# Patient Record
Sex: Male | Born: 1975 | Race: White | Hispanic: No | Smoking: Never smoker
Health system: Southern US, Community
[De-identification: ages and names within clinical notes are randomized; demographics above are authoritative.]

## PROBLEM LIST (undated history)

## (undated) ENCOUNTER — Ambulatory Visit: Admission: EM | Payer: BC Managed Care – PPO

## (undated) DIAGNOSIS — I10 Essential (primary) hypertension: Secondary | ICD-10-CM

## (undated) DIAGNOSIS — E785 Hyperlipidemia, unspecified: Secondary | ICD-10-CM

## (undated) DIAGNOSIS — S83411S Sprain of medial collateral ligament of right knee, sequela: Secondary | ICD-10-CM

## (undated) DIAGNOSIS — E291 Testicular hypofunction: Secondary | ICD-10-CM

## (undated) DIAGNOSIS — E6609 Other obesity due to excess calories: Secondary | ICD-10-CM

## (undated) DIAGNOSIS — N529 Male erectile dysfunction, unspecified: Secondary | ICD-10-CM

## (undated) DIAGNOSIS — K219 Gastro-esophageal reflux disease without esophagitis: Secondary | ICD-10-CM

## (undated) DIAGNOSIS — E118 Type 2 diabetes mellitus with unspecified complications: Secondary | ICD-10-CM

## (undated) DIAGNOSIS — F419 Anxiety disorder, unspecified: Secondary | ICD-10-CM

## (undated) DIAGNOSIS — E783 Hyperchylomicronemia: Secondary | ICD-10-CM

## (undated) HISTORY — DX: Other obesity due to excess calories: E66.09

## (undated) HISTORY — DX: Essential (primary) hypertension: I10

## (undated) HISTORY — DX: Anxiety disorder, unspecified: F41.9

## (undated) HISTORY — DX: Sprain of medial collateral ligament of right knee, sequela: S83.411S

## (undated) HISTORY — DX: Male erectile dysfunction, unspecified: N52.9

## (undated) HISTORY — DX: Gastro-esophageal reflux disease without esophagitis: K21.9

## (undated) HISTORY — DX: Type 2 diabetes mellitus with unspecified complications: E11.8

## (undated) HISTORY — DX: Hyperchylomicronemia: E78.3

## (undated) HISTORY — DX: Hyperlipidemia, unspecified: E78.5

## (undated) HISTORY — DX: Testicular hypofunction: E29.1

---

## 1998-09-09 HISTORY — PX: LAPAROSCOPIC APPENDECTOMY: SUR753

## 2009-01-24 ENCOUNTER — Encounter: Admission: RE | Admit: 2009-01-24 | Discharge: 2009-01-24 | Payer: Self-pay | Admitting: Family Medicine

## 2012-03-22 HISTORY — PX: LEFT HEART CATH AND CORONARY ANGIOGRAPHY: CATH118249

## 2014-09-09 HISTORY — PX: HERNIA REPAIR: SHX51

## 2017-08-19 DIAGNOSIS — L02415 Cutaneous abscess of right lower limb: Secondary | ICD-10-CM | POA: Diagnosis not present

## 2017-08-19 DIAGNOSIS — L03314 Cellulitis of groin: Secondary | ICD-10-CM | POA: Diagnosis not present

## 2018-01-26 DIAGNOSIS — I10 Essential (primary) hypertension: Secondary | ICD-10-CM | POA: Diagnosis not present

## 2018-01-26 DIAGNOSIS — K429 Umbilical hernia without obstruction or gangrene: Secondary | ICD-10-CM | POA: Diagnosis not present

## 2018-01-26 DIAGNOSIS — K219 Gastro-esophageal reflux disease without esophagitis: Secondary | ICD-10-CM | POA: Diagnosis not present

## 2018-02-06 DIAGNOSIS — K432 Incisional hernia without obstruction or gangrene: Secondary | ICD-10-CM | POA: Diagnosis not present

## 2018-02-06 DIAGNOSIS — M6208 Separation of muscle (nontraumatic), other site: Secondary | ICD-10-CM | POA: Diagnosis not present

## 2018-02-17 DIAGNOSIS — I10 Essential (primary) hypertension: Secondary | ICD-10-CM | POA: Diagnosis not present

## 2018-02-24 DIAGNOSIS — I10 Essential (primary) hypertension: Secondary | ICD-10-CM | POA: Diagnosis not present

## 2018-03-02 DIAGNOSIS — K432 Incisional hernia without obstruction or gangrene: Secondary | ICD-10-CM | POA: Diagnosis not present

## 2018-03-02 DIAGNOSIS — E119 Type 2 diabetes mellitus without complications: Secondary | ICD-10-CM | POA: Diagnosis not present

## 2018-03-02 DIAGNOSIS — K660 Peritoneal adhesions (postprocedural) (postinfection): Secondary | ICD-10-CM | POA: Diagnosis not present

## 2018-03-02 DIAGNOSIS — K436 Other and unspecified ventral hernia with obstruction, without gangrene: Secondary | ICD-10-CM | POA: Diagnosis not present

## 2018-03-26 DIAGNOSIS — Z6836 Body mass index (BMI) 36.0-36.9, adult: Secondary | ICD-10-CM | POA: Diagnosis not present

## 2018-03-26 DIAGNOSIS — I10 Essential (primary) hypertension: Secondary | ICD-10-CM | POA: Diagnosis not present

## 2018-05-12 DIAGNOSIS — Z79899 Other long term (current) drug therapy: Secondary | ICD-10-CM | POA: Diagnosis not present

## 2018-05-12 DIAGNOSIS — I10 Essential (primary) hypertension: Secondary | ICD-10-CM | POA: Diagnosis not present

## 2018-05-12 DIAGNOSIS — Z833 Family history of diabetes mellitus: Secondary | ICD-10-CM | POA: Diagnosis not present

## 2018-05-12 DIAGNOSIS — E782 Mixed hyperlipidemia: Secondary | ICD-10-CM | POA: Diagnosis not present

## 2018-05-15 DIAGNOSIS — E349 Endocrine disorder, unspecified: Secondary | ICD-10-CM | POA: Diagnosis not present

## 2018-05-29 DIAGNOSIS — E291 Testicular hypofunction: Secondary | ICD-10-CM | POA: Diagnosis not present

## 2019-04-01 ENCOUNTER — Other Ambulatory Visit: Payer: Self-pay | Admitting: Physician Assistant

## 2019-04-01 DIAGNOSIS — S83401A Sprain of unspecified collateral ligament of right knee, initial encounter: Secondary | ICD-10-CM

## 2019-04-02 ENCOUNTER — Other Ambulatory Visit: Payer: Self-pay | Admitting: Physician Assistant

## 2019-04-02 DIAGNOSIS — S83401A Sprain of unspecified collateral ligament of right knee, initial encounter: Secondary | ICD-10-CM

## 2019-04-17 ENCOUNTER — Other Ambulatory Visit: Payer: Self-pay

## 2019-04-17 ENCOUNTER — Ambulatory Visit
Admission: RE | Admit: 2019-04-17 | Discharge: 2019-04-17 | Disposition: A | Discharge status: Home / Self Care | Payer: Self-pay | Source: Ambulatory Visit | Attending: Physician Assistant | Admitting: Physician Assistant

## 2019-04-17 DIAGNOSIS — S83401A Sprain of unspecified collateral ligament of right knee, initial encounter: Secondary | ICD-10-CM

## 2019-10-15 DIAGNOSIS — E291 Testicular hypofunction: Secondary | ICD-10-CM | POA: Diagnosis not present

## 2019-10-29 DIAGNOSIS — E291 Testicular hypofunction: Secondary | ICD-10-CM | POA: Diagnosis not present

## 2019-11-04 DIAGNOSIS — R0602 Shortness of breath: Secondary | ICD-10-CM | POA: Diagnosis not present

## 2019-11-04 DIAGNOSIS — G4733 Obstructive sleep apnea (adult) (pediatric): Secondary | ICD-10-CM | POA: Diagnosis not present

## 2019-11-05 DIAGNOSIS — R0602 Shortness of breath: Secondary | ICD-10-CM | POA: Diagnosis not present

## 2019-11-05 DIAGNOSIS — G4733 Obstructive sleep apnea (adult) (pediatric): Secondary | ICD-10-CM | POA: Diagnosis not present

## 2019-11-15 DIAGNOSIS — E291 Testicular hypofunction: Secondary | ICD-10-CM | POA: Diagnosis not present

## 2019-12-03 DIAGNOSIS — E291 Testicular hypofunction: Secondary | ICD-10-CM | POA: Diagnosis not present

## 2019-12-17 DIAGNOSIS — E291 Testicular hypofunction: Secondary | ICD-10-CM | POA: Diagnosis not present

## 2020-01-14 DIAGNOSIS — K219 Gastro-esophageal reflux disease without esophagitis: Secondary | ICD-10-CM | POA: Diagnosis not present

## 2020-01-14 DIAGNOSIS — E781 Pure hyperglyceridemia: Secondary | ICD-10-CM | POA: Diagnosis not present

## 2020-01-14 DIAGNOSIS — E291 Testicular hypofunction: Secondary | ICD-10-CM | POA: Diagnosis not present

## 2020-01-14 DIAGNOSIS — R0789 Other chest pain: Secondary | ICD-10-CM | POA: Diagnosis not present

## 2020-01-14 DIAGNOSIS — R9431 Abnormal electrocardiogram [ECG] [EKG]: Secondary | ICD-10-CM | POA: Diagnosis not present

## 2020-01-24 DIAGNOSIS — R0789 Other chest pain: Secondary | ICD-10-CM | POA: Diagnosis not present

## 2020-01-24 DIAGNOSIS — R0600 Dyspnea, unspecified: Secondary | ICD-10-CM | POA: Diagnosis not present

## 2020-01-24 DIAGNOSIS — R9431 Abnormal electrocardiogram [ECG] [EKG]: Secondary | ICD-10-CM | POA: Diagnosis not present

## 2020-01-24 DIAGNOSIS — I10 Essential (primary) hypertension: Secondary | ICD-10-CM | POA: Diagnosis not present

## 2020-02-01 DIAGNOSIS — E291 Testicular hypofunction: Secondary | ICD-10-CM | POA: Diagnosis not present

## 2020-02-03 DIAGNOSIS — R9431 Abnormal electrocardiogram [ECG] [EKG]: Secondary | ICD-10-CM | POA: Diagnosis not present

## 2020-02-03 DIAGNOSIS — E781 Pure hyperglyceridemia: Secondary | ICD-10-CM | POA: Diagnosis not present

## 2020-02-03 DIAGNOSIS — R0600 Dyspnea, unspecified: Secondary | ICD-10-CM | POA: Diagnosis not present

## 2020-02-03 DIAGNOSIS — R0789 Other chest pain: Secondary | ICD-10-CM | POA: Diagnosis not present

## 2020-02-03 DIAGNOSIS — I10 Essential (primary) hypertension: Secondary | ICD-10-CM | POA: Diagnosis not present

## 2020-05-28 IMAGING — MR MRI OF THE RIGHT KNEE WITHOUT CONTRAST
4 of 7 series · 19 of 40 positions shown · non-contrast
Comparison: Radiographs 03/22/2019.

CLINICAL DATA: Anterolateral right knee pain and swelling since
injury one month ago. No previous relevant surgery.

EXAM:
MRI OF THE RIGHT KNEE WITHOUT CONTRAST
TECHNIQUE: Multiplanar, multisequence MR imaging of the knee was performed. No
intravenous contrast was administered.

[Series 3: T2 fat-sat · axial · 4.0mm · 0.31mm/px · z∈[-19,+95]mm · 3 of 27 slices shown]
[im 1/27]
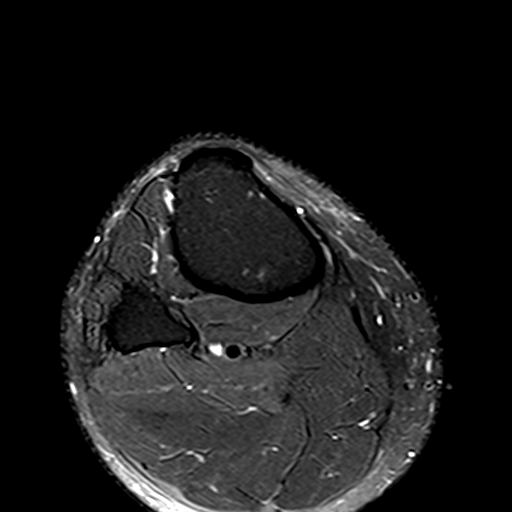
[im 14/27]
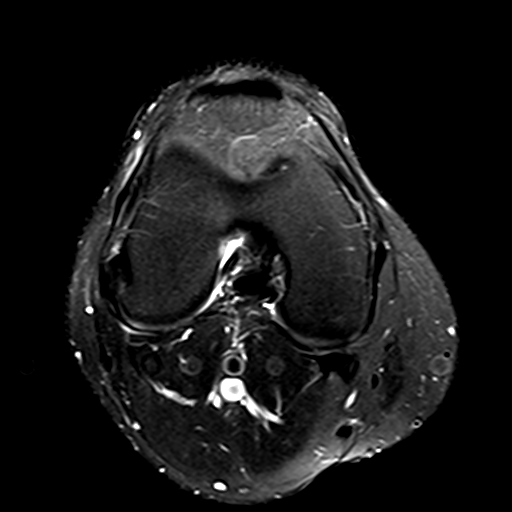
[im 27/27]
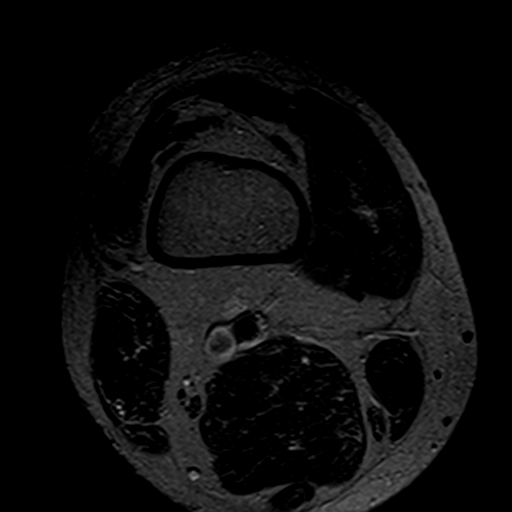

[Series 6: PD fat-sat · coronal · 3.0mm · 0.29mm/px · 7 of 31 slices shown (1 of 3)]
[im 1/31]
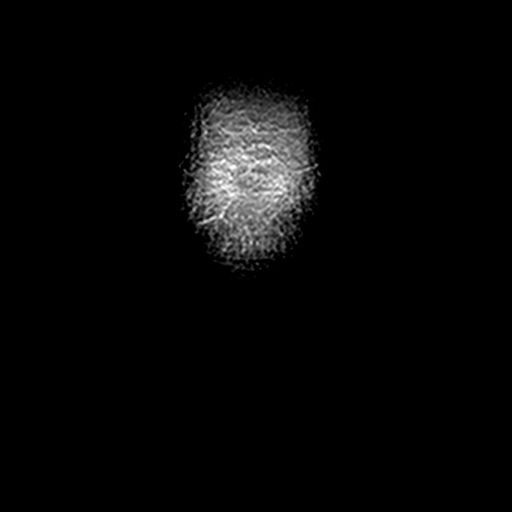
[im 6/31]
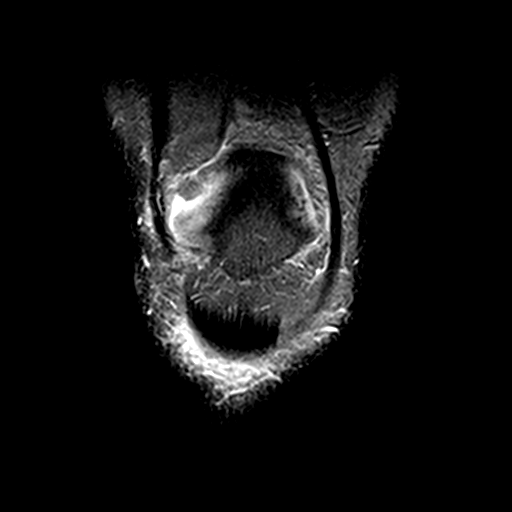
[im 11/31]
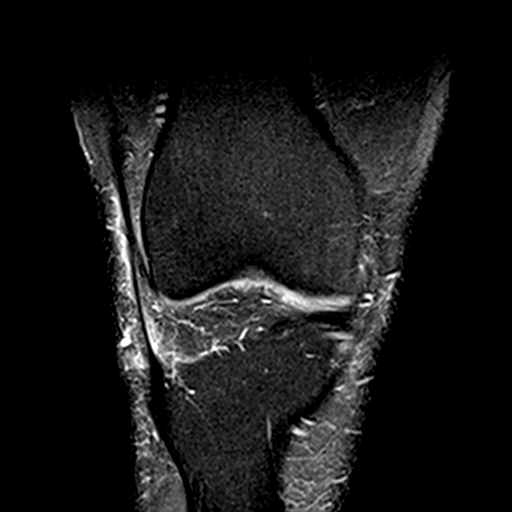
[im 16/31]
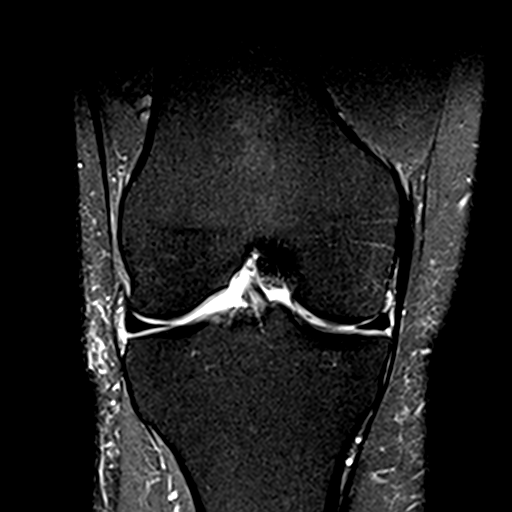
[im 21/31]
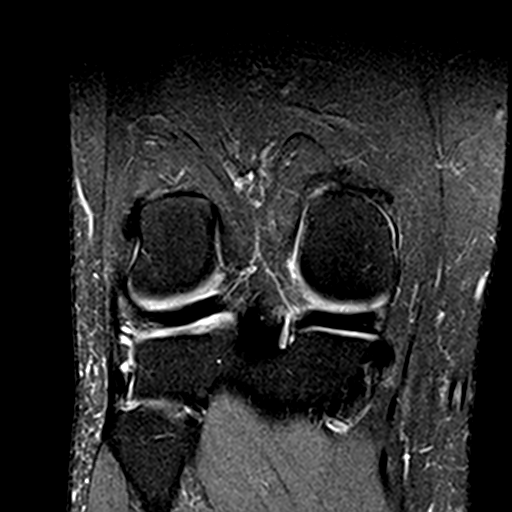
[im 26/31]
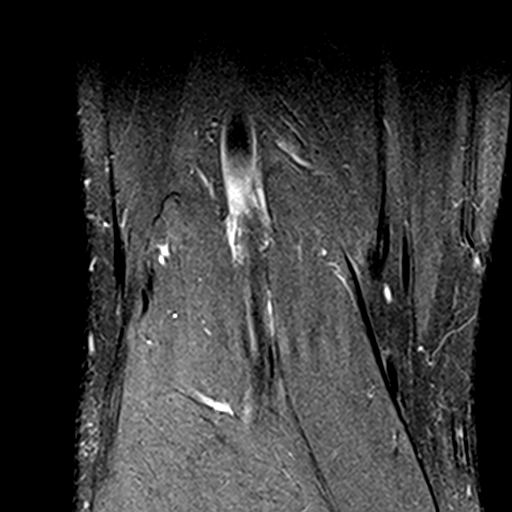
[im 31/31]
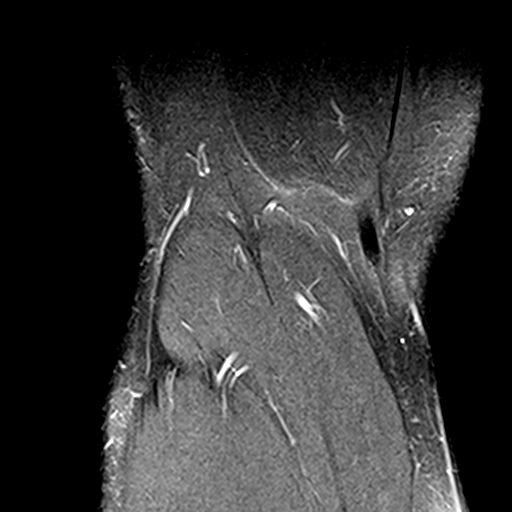

[Series 7: PD fat-sat · sagittal · 3.0mm · 0.29mm/px · 7 of 30 slices shown (2 of 3)]
[im 1/30]
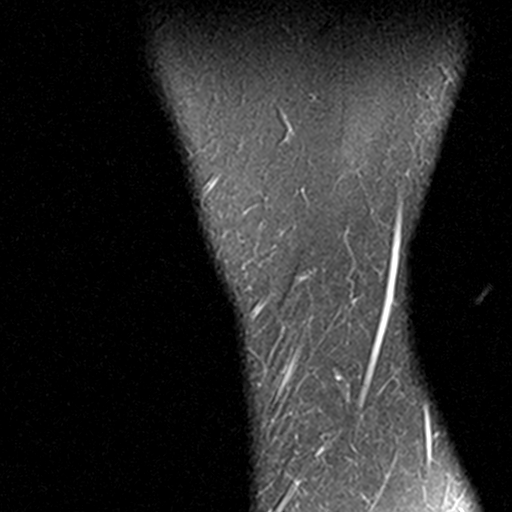
[im 5/30]
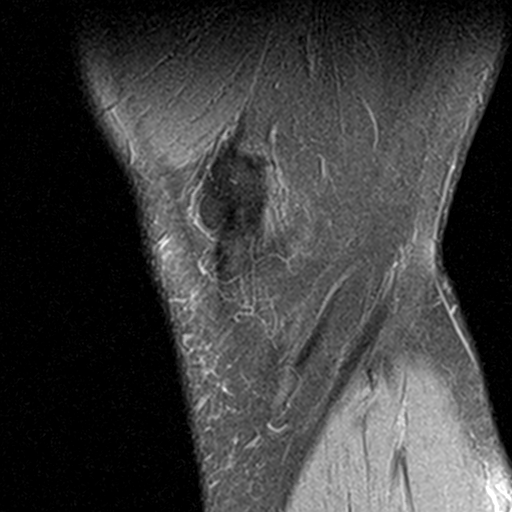
[im 10/30]
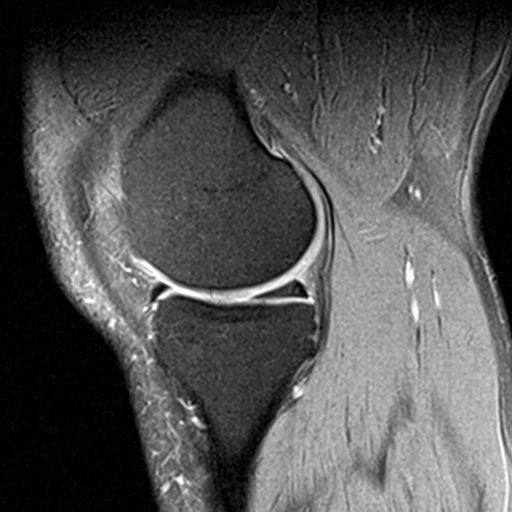
[im 15/30]
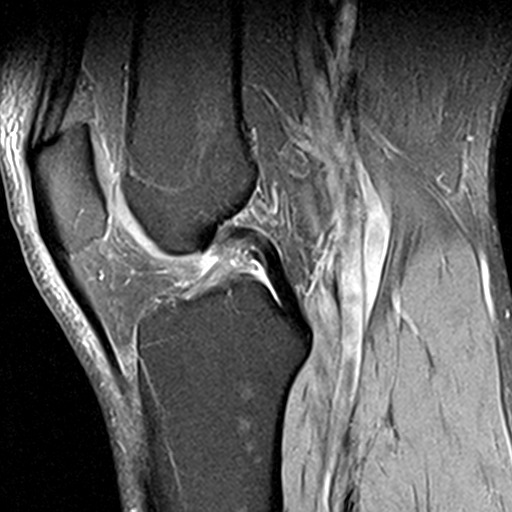
[im 20/30]
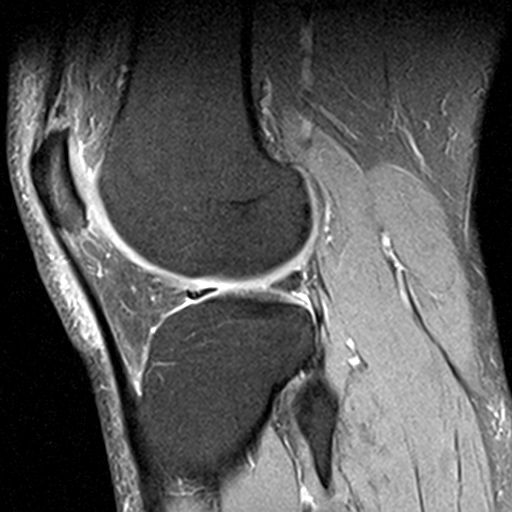
[im 25/30]
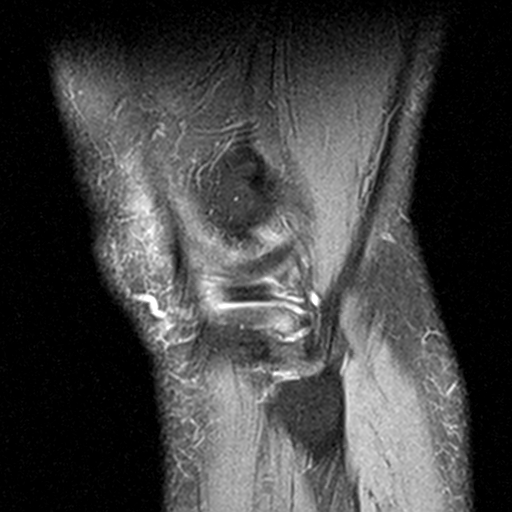
[im 30/30]
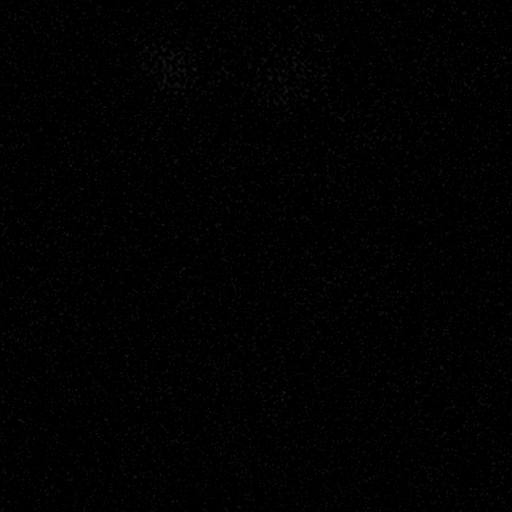

[Series 9: PD fat-sat · oblique · 2.0mm · 0.29mm/px · 2 of 11 slices shown (3 of 3)]
[im 1/11]
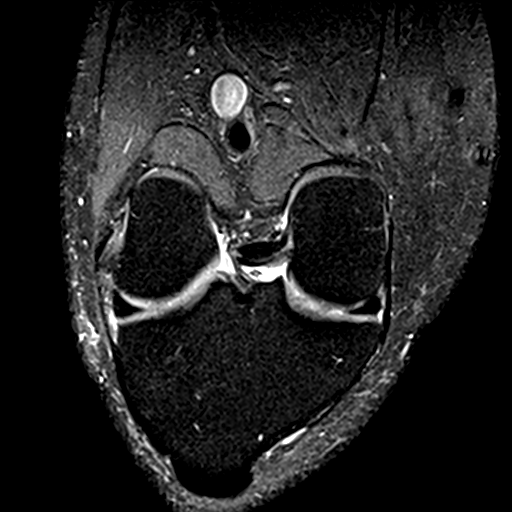
[im 11/11]
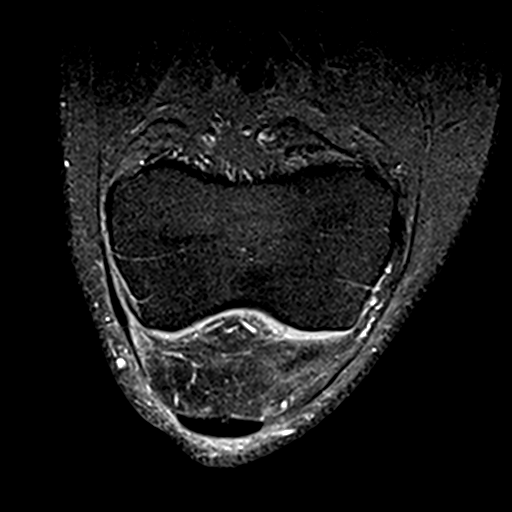

[19 of 40 positions shown; findings below may reference images not displayed]

FINDINGS: MENISCI

Medial meniscus:  Intact with normal morphology.

Lateral meniscus:  Intact with normal morphology.

LIGAMENTS

Cruciates:  Intact.

Collaterals:  Intact.

CARTILAGE

Patellofemoral:  Preserved.

Medial:  Minimal chondral thinning without focal defect.

Lateral: Minimal chondral thinning without focal defect. Minimal
peripheral spurring.

MISCELLANEOUS

Joint:  No significant joint effusion.

Popliteal Fossa:  Unremarkable. No significant Baker's cyst.

Extensor Mechanism:  Intact.

Bones:  No acute or significant extra-articular osseous findings.

Other: Minimal subcutaneous edema anterolaterally without focal
fluid collection.
IMPRESSION: Minimal subcutaneous edema anterolaterally without focal fluid
collection. No evidence of internal derangement or fracture. Minimal
degenerative chondrosis.

## 2021-07-16 DIAGNOSIS — J40 Bronchitis, not specified as acute or chronic: Secondary | ICD-10-CM | POA: Diagnosis not present

## 2021-07-16 DIAGNOSIS — Z6830 Body mass index (BMI) 30.0-30.9, adult: Secondary | ICD-10-CM | POA: Diagnosis not present

## 2021-07-20 DIAGNOSIS — I10 Essential (primary) hypertension: Secondary | ICD-10-CM | POA: Diagnosis not present

## 2021-07-20 DIAGNOSIS — J101 Influenza due to other identified influenza virus with other respiratory manifestations: Secondary | ICD-10-CM | POA: Diagnosis not present

## 2021-07-20 DIAGNOSIS — R059 Cough, unspecified: Secondary | ICD-10-CM | POA: Diagnosis not present

## 2021-07-20 DIAGNOSIS — Z6830 Body mass index (BMI) 30.0-30.9, adult: Secondary | ICD-10-CM | POA: Diagnosis not present

## 2022-02-11 DIAGNOSIS — R0789 Other chest pain: Secondary | ICD-10-CM | POA: Diagnosis not present

## 2022-02-11 DIAGNOSIS — I119 Hypertensive heart disease without heart failure: Secondary | ICD-10-CM | POA: Diagnosis not present

## 2022-02-11 DIAGNOSIS — K219 Gastro-esophageal reflux disease without esophagitis: Secondary | ICD-10-CM | POA: Diagnosis not present

## 2022-02-11 DIAGNOSIS — Z7952 Long term (current) use of systemic steroids: Secondary | ICD-10-CM | POA: Diagnosis not present

## 2022-02-11 DIAGNOSIS — I10 Essential (primary) hypertension: Secondary | ICD-10-CM | POA: Diagnosis not present

## 2022-02-11 DIAGNOSIS — F17220 Nicotine dependence, chewing tobacco, uncomplicated: Secondary | ICD-10-CM | POA: Diagnosis not present

## 2022-02-11 DIAGNOSIS — R0602 Shortness of breath: Secondary | ICD-10-CM | POA: Diagnosis not present

## 2022-02-11 DIAGNOSIS — R0609 Other forms of dyspnea: Secondary | ICD-10-CM | POA: Diagnosis not present

## 2022-02-11 DIAGNOSIS — I351 Nonrheumatic aortic (valve) insufficiency: Secondary | ICD-10-CM | POA: Diagnosis not present

## 2022-02-11 DIAGNOSIS — Z79899 Other long term (current) drug therapy: Secondary | ICD-10-CM | POA: Diagnosis not present

## 2022-02-11 DIAGNOSIS — R079 Chest pain, unspecified: Secondary | ICD-10-CM | POA: Diagnosis not present

## 2022-02-11 DIAGNOSIS — Z888 Allergy status to other drugs, medicaments and biological substances status: Secondary | ICD-10-CM | POA: Diagnosis not present

## 2022-02-11 DIAGNOSIS — I517 Cardiomegaly: Secondary | ICD-10-CM | POA: Diagnosis not present

## 2022-02-11 HISTORY — PX: TRANSTHORACIC ECHOCARDIOGRAM: SHX275

## 2022-02-19 DIAGNOSIS — Z6837 Body mass index (BMI) 37.0-37.9, adult: Secondary | ICD-10-CM | POA: Diagnosis not present

## 2022-02-19 DIAGNOSIS — R0789 Other chest pain: Secondary | ICD-10-CM | POA: Diagnosis not present

## 2022-02-19 DIAGNOSIS — I119 Hypertensive heart disease without heart failure: Secondary | ICD-10-CM | POA: Diagnosis not present

## 2022-02-19 DIAGNOSIS — I10 Essential (primary) hypertension: Secondary | ICD-10-CM | POA: Diagnosis not present

## 2022-02-20 DIAGNOSIS — Z6839 Body mass index (BMI) 39.0-39.9, adult: Secondary | ICD-10-CM | POA: Diagnosis not present

## 2022-02-20 DIAGNOSIS — R635 Abnormal weight gain: Secondary | ICD-10-CM | POA: Diagnosis not present

## 2022-02-20 DIAGNOSIS — I10 Essential (primary) hypertension: Secondary | ICD-10-CM | POA: Diagnosis not present

## 2022-02-20 DIAGNOSIS — I5030 Unspecified diastolic (congestive) heart failure: Secondary | ICD-10-CM | POA: Diagnosis not present

## 2022-02-20 DIAGNOSIS — Z79899 Other long term (current) drug therapy: Secondary | ICD-10-CM | POA: Diagnosis not present

## 2022-02-27 DIAGNOSIS — K219 Gastro-esophageal reflux disease without esophagitis: Secondary | ICD-10-CM | POA: Diagnosis not present

## 2022-02-27 DIAGNOSIS — E119 Type 2 diabetes mellitus without complications: Secondary | ICD-10-CM | POA: Diagnosis not present

## 2022-02-27 DIAGNOSIS — R0789 Other chest pain: Secondary | ICD-10-CM | POA: Diagnosis not present

## 2022-02-27 DIAGNOSIS — I10 Essential (primary) hypertension: Secondary | ICD-10-CM | POA: Diagnosis not present

## 2022-02-27 DIAGNOSIS — Z6839 Body mass index (BMI) 39.0-39.9, adult: Secondary | ICD-10-CM | POA: Diagnosis not present

## 2022-02-27 DIAGNOSIS — Z79899 Other long term (current) drug therapy: Secondary | ICD-10-CM | POA: Diagnosis not present

## 2022-03-03 DIAGNOSIS — K219 Gastro-esophageal reflux disease without esophagitis: Secondary | ICD-10-CM | POA: Diagnosis not present

## 2022-03-03 DIAGNOSIS — M546 Pain in thoracic spine: Secondary | ICD-10-CM | POA: Diagnosis not present

## 2022-03-03 DIAGNOSIS — M62830 Muscle spasm of back: Secondary | ICD-10-CM | POA: Diagnosis not present

## 2022-03-03 DIAGNOSIS — Z888 Allergy status to other drugs, medicaments and biological substances status: Secondary | ICD-10-CM | POA: Diagnosis not present

## 2022-03-03 DIAGNOSIS — R109 Unspecified abdominal pain: Secondary | ICD-10-CM | POA: Diagnosis not present

## 2022-03-03 DIAGNOSIS — F17220 Nicotine dependence, chewing tobacco, uncomplicated: Secondary | ICD-10-CM | POA: Diagnosis not present

## 2022-03-03 DIAGNOSIS — Z6838 Body mass index (BMI) 38.0-38.9, adult: Secondary | ICD-10-CM | POA: Diagnosis not present

## 2022-03-03 DIAGNOSIS — M5489 Other dorsalgia: Secondary | ICD-10-CM | POA: Diagnosis not present

## 2022-03-03 DIAGNOSIS — I10 Essential (primary) hypertension: Secondary | ICD-10-CM | POA: Diagnosis not present

## 2022-03-04 DIAGNOSIS — R109 Unspecified abdominal pain: Secondary | ICD-10-CM | POA: Diagnosis not present

## 2022-03-08 DIAGNOSIS — R0789 Other chest pain: Secondary | ICD-10-CM | POA: Diagnosis not present

## 2022-03-08 DIAGNOSIS — I119 Hypertensive heart disease without heart failure: Secondary | ICD-10-CM | POA: Diagnosis not present

## 2022-03-08 DIAGNOSIS — Z79899 Other long term (current) drug therapy: Secondary | ICD-10-CM | POA: Diagnosis not present

## 2022-03-08 DIAGNOSIS — Z6838 Body mass index (BMI) 38.0-38.9, adult: Secondary | ICD-10-CM | POA: Diagnosis not present

## 2022-03-08 DIAGNOSIS — E119 Type 2 diabetes mellitus without complications: Secondary | ICD-10-CM | POA: Diagnosis not present

## 2022-03-08 DIAGNOSIS — R0602 Shortness of breath: Secondary | ICD-10-CM | POA: Diagnosis not present

## 2022-03-08 DIAGNOSIS — Z7984 Long term (current) use of oral hypoglycemic drugs: Secondary | ICD-10-CM | POA: Diagnosis not present

## 2022-03-08 DIAGNOSIS — R42 Dizziness and giddiness: Secondary | ICD-10-CM | POA: Diagnosis not present

## 2022-03-08 DIAGNOSIS — I10 Essential (primary) hypertension: Secondary | ICD-10-CM | POA: Diagnosis not present

## 2022-03-08 DIAGNOSIS — R079 Chest pain, unspecified: Secondary | ICD-10-CM | POA: Diagnosis not present

## 2022-03-08 HISTORY — PX: OTHER SURGICAL HISTORY: SHX169

## 2022-03-09 DIAGNOSIS — I2510 Atherosclerotic heart disease of native coronary artery without angina pectoris: Secondary | ICD-10-CM

## 2022-03-09 DIAGNOSIS — R079 Chest pain, unspecified: Secondary | ICD-10-CM | POA: Diagnosis not present

## 2022-03-09 HISTORY — DX: Atherosclerotic heart disease of native coronary artery without angina pectoris: I25.10

## 2022-03-14 DIAGNOSIS — R9439 Abnormal result of other cardiovascular function study: Secondary | ICD-10-CM | POA: Diagnosis not present

## 2022-03-14 DIAGNOSIS — R0789 Other chest pain: Secondary | ICD-10-CM | POA: Diagnosis not present

## 2022-03-14 DIAGNOSIS — I10 Essential (primary) hypertension: Secondary | ICD-10-CM | POA: Diagnosis not present

## 2022-03-14 DIAGNOSIS — I119 Hypertensive heart disease without heart failure: Secondary | ICD-10-CM | POA: Diagnosis not present

## 2022-03-22 DIAGNOSIS — Z87891 Personal history of nicotine dependence: Secondary | ICD-10-CM | POA: Diagnosis not present

## 2022-03-22 DIAGNOSIS — R0789 Other chest pain: Secondary | ICD-10-CM | POA: Diagnosis not present

## 2022-03-22 DIAGNOSIS — I119 Hypertensive heart disease without heart failure: Secondary | ICD-10-CM | POA: Diagnosis not present

## 2022-03-22 DIAGNOSIS — Z7984 Long term (current) use of oral hypoglycemic drugs: Secondary | ICD-10-CM | POA: Diagnosis not present

## 2022-03-22 DIAGNOSIS — E119 Type 2 diabetes mellitus without complications: Secondary | ICD-10-CM | POA: Diagnosis not present

## 2022-03-22 DIAGNOSIS — E669 Obesity, unspecified: Secondary | ICD-10-CM | POA: Diagnosis not present

## 2022-03-22 DIAGNOSIS — E785 Hyperlipidemia, unspecified: Secondary | ICD-10-CM | POA: Diagnosis not present

## 2022-03-22 DIAGNOSIS — Z79899 Other long term (current) drug therapy: Secondary | ICD-10-CM | POA: Diagnosis not present

## 2022-03-22 DIAGNOSIS — Z6838 Body mass index (BMI) 38.0-38.9, adult: Secondary | ICD-10-CM | POA: Diagnosis not present

## 2022-03-22 DIAGNOSIS — R9439 Abnormal result of other cardiovascular function study: Secondary | ICD-10-CM | POA: Diagnosis not present

## 2022-03-22 DIAGNOSIS — I2510 Atherosclerotic heart disease of native coronary artery without angina pectoris: Secondary | ICD-10-CM | POA: Diagnosis not present

## 2022-03-22 HISTORY — PX: PERCUTANEOUS CORONARY STENT INTERVENTION (PCI-S): SHX6016

## 2022-03-24 DIAGNOSIS — I25118 Atherosclerotic heart disease of native coronary artery with other forms of angina pectoris: Secondary | ICD-10-CM | POA: Diagnosis not present

## 2022-03-26 DIAGNOSIS — Z87891 Personal history of nicotine dependence: Secondary | ICD-10-CM | POA: Diagnosis not present

## 2022-03-26 DIAGNOSIS — I10 Essential (primary) hypertension: Secondary | ICD-10-CM | POA: Diagnosis not present

## 2022-03-26 DIAGNOSIS — I440 Atrioventricular block, first degree: Secondary | ICD-10-CM | POA: Diagnosis not present

## 2022-03-26 DIAGNOSIS — Z9049 Acquired absence of other specified parts of digestive tract: Secondary | ICD-10-CM | POA: Diagnosis not present

## 2022-03-26 DIAGNOSIS — I2510 Atherosclerotic heart disease of native coronary artery without angina pectoris: Secondary | ICD-10-CM | POA: Diagnosis not present

## 2022-03-26 DIAGNOSIS — H539 Unspecified visual disturbance: Secondary | ICD-10-CM | POA: Diagnosis not present

## 2022-03-26 DIAGNOSIS — Z7902 Long term (current) use of antithrombotics/antiplatelets: Secondary | ICD-10-CM | POA: Diagnosis not present

## 2022-03-26 DIAGNOSIS — E119 Type 2 diabetes mellitus without complications: Secondary | ICD-10-CM | POA: Diagnosis not present

## 2022-03-26 DIAGNOSIS — Z6837 Body mass index (BMI) 37.0-37.9, adult: Secondary | ICD-10-CM | POA: Diagnosis not present

## 2022-03-26 DIAGNOSIS — R079 Chest pain, unspecified: Secondary | ICD-10-CM | POA: Diagnosis not present

## 2022-03-26 DIAGNOSIS — Z7982 Long term (current) use of aspirin: Secondary | ICD-10-CM | POA: Diagnosis not present

## 2022-03-26 DIAGNOSIS — R0789 Other chest pain: Secondary | ICD-10-CM | POA: Diagnosis not present

## 2022-03-26 DIAGNOSIS — Z888 Allergy status to other drugs, medicaments and biological substances status: Secondary | ICD-10-CM | POA: Diagnosis not present

## 2022-03-26 DIAGNOSIS — Z7984 Long term (current) use of oral hypoglycemic drugs: Secondary | ICD-10-CM | POA: Diagnosis not present

## 2022-03-26 DIAGNOSIS — Z79899 Other long term (current) drug therapy: Secondary | ICD-10-CM | POA: Diagnosis not present

## 2022-03-28 DIAGNOSIS — R079 Chest pain, unspecified: Secondary | ICD-10-CM | POA: Diagnosis not present

## 2022-03-29 DIAGNOSIS — Z955 Presence of coronary angioplasty implant and graft: Secondary | ICD-10-CM | POA: Diagnosis not present

## 2022-03-29 DIAGNOSIS — I25118 Atherosclerotic heart disease of native coronary artery with other forms of angina pectoris: Secondary | ICD-10-CM | POA: Diagnosis not present

## 2022-03-29 DIAGNOSIS — I10 Essential (primary) hypertension: Secondary | ICD-10-CM | POA: Diagnosis not present

## 2022-03-29 DIAGNOSIS — I119 Hypertensive heart disease without heart failure: Secondary | ICD-10-CM | POA: Diagnosis not present

## 2022-04-03 DIAGNOSIS — Z6838 Body mass index (BMI) 38.0-38.9, adult: Secondary | ICD-10-CM | POA: Diagnosis not present

## 2022-04-03 DIAGNOSIS — Z125 Encounter for screening for malignant neoplasm of prostate: Secondary | ICD-10-CM | POA: Diagnosis not present

## 2022-04-03 DIAGNOSIS — E785 Hyperlipidemia, unspecified: Secondary | ICD-10-CM | POA: Diagnosis not present

## 2022-04-03 DIAGNOSIS — Z79899 Other long term (current) drug therapy: Secondary | ICD-10-CM | POA: Diagnosis not present

## 2022-04-03 DIAGNOSIS — I10 Essential (primary) hypertension: Secondary | ICD-10-CM | POA: Diagnosis not present

## 2022-04-03 DIAGNOSIS — E119 Type 2 diabetes mellitus without complications: Secondary | ICD-10-CM | POA: Diagnosis not present

## 2022-04-17 DIAGNOSIS — R748 Abnormal levels of other serum enzymes: Secondary | ICD-10-CM | POA: Diagnosis not present

## 2022-04-22 DIAGNOSIS — R079 Chest pain, unspecified: Secondary | ICD-10-CM | POA: Diagnosis not present

## 2022-04-22 DIAGNOSIS — Z7982 Long term (current) use of aspirin: Secondary | ICD-10-CM | POA: Diagnosis not present

## 2022-04-22 DIAGNOSIS — E119 Type 2 diabetes mellitus without complications: Secondary | ICD-10-CM | POA: Diagnosis not present

## 2022-04-22 DIAGNOSIS — K219 Gastro-esophageal reflux disease without esophagitis: Secondary | ICD-10-CM | POA: Diagnosis not present

## 2022-04-22 DIAGNOSIS — I209 Angina pectoris, unspecified: Secondary | ICD-10-CM | POA: Diagnosis not present

## 2022-04-22 DIAGNOSIS — Z8249 Family history of ischemic heart disease and other diseases of the circulatory system: Secondary | ICD-10-CM | POA: Diagnosis not present

## 2022-04-22 DIAGNOSIS — Z87891 Personal history of nicotine dependence: Secondary | ICD-10-CM | POA: Diagnosis not present

## 2022-04-22 DIAGNOSIS — R0789 Other chest pain: Secondary | ICD-10-CM | POA: Diagnosis not present

## 2022-04-22 DIAGNOSIS — Z79899 Other long term (current) drug therapy: Secondary | ICD-10-CM | POA: Diagnosis not present

## 2022-04-22 DIAGNOSIS — Z6837 Body mass index (BMI) 37.0-37.9, adult: Secondary | ICD-10-CM | POA: Diagnosis not present

## 2022-04-22 DIAGNOSIS — Z7984 Long term (current) use of oral hypoglycemic drugs: Secondary | ICD-10-CM | POA: Diagnosis not present

## 2022-04-22 DIAGNOSIS — I10 Essential (primary) hypertension: Secondary | ICD-10-CM | POA: Diagnosis not present

## 2022-04-22 DIAGNOSIS — Z888 Allergy status to other drugs, medicaments and biological substances status: Secondary | ICD-10-CM | POA: Diagnosis not present

## 2022-04-23 DIAGNOSIS — R079 Chest pain, unspecified: Secondary | ICD-10-CM | POA: Diagnosis not present

## 2022-04-26 DIAGNOSIS — R7989 Other specified abnormal findings of blood chemistry: Secondary | ICD-10-CM | POA: Diagnosis not present

## 2022-04-26 DIAGNOSIS — I240 Acute coronary thrombosis not resulting in myocardial infarction: Secondary | ICD-10-CM | POA: Diagnosis not present

## 2022-04-26 DIAGNOSIS — R079 Chest pain, unspecified: Secondary | ICD-10-CM | POA: Diagnosis not present

## 2022-04-26 DIAGNOSIS — N179 Acute kidney failure, unspecified: Secondary | ICD-10-CM | POA: Diagnosis not present

## 2022-04-29 DIAGNOSIS — R7989 Other specified abnormal findings of blood chemistry: Secondary | ICD-10-CM | POA: Diagnosis not present

## 2022-04-29 DIAGNOSIS — N179 Acute kidney failure, unspecified: Secondary | ICD-10-CM | POA: Diagnosis not present

## 2022-04-29 DIAGNOSIS — R748 Abnormal levels of other serum enzymes: Secondary | ICD-10-CM | POA: Diagnosis not present

## 2022-05-08 DIAGNOSIS — I214 Non-ST elevation (NSTEMI) myocardial infarction: Secondary | ICD-10-CM | POA: Diagnosis not present

## 2022-05-10 DIAGNOSIS — I208 Other forms of angina pectoris: Secondary | ICD-10-CM | POA: Diagnosis not present

## 2022-05-10 DIAGNOSIS — Z7902 Long term (current) use of antithrombotics/antiplatelets: Secondary | ICD-10-CM | POA: Diagnosis not present

## 2022-05-10 DIAGNOSIS — Z79899 Other long term (current) drug therapy: Secondary | ICD-10-CM | POA: Diagnosis not present

## 2022-05-10 DIAGNOSIS — Z7982 Long term (current) use of aspirin: Secondary | ICD-10-CM | POA: Diagnosis not present

## 2022-05-10 DIAGNOSIS — E785 Hyperlipidemia, unspecified: Secondary | ICD-10-CM | POA: Diagnosis not present

## 2022-05-10 DIAGNOSIS — E119 Type 2 diabetes mellitus without complications: Secondary | ICD-10-CM | POA: Diagnosis not present

## 2022-05-10 DIAGNOSIS — I25118 Atherosclerotic heart disease of native coronary artery with other forms of angina pectoris: Secondary | ICD-10-CM | POA: Diagnosis not present

## 2022-05-10 DIAGNOSIS — Z87891 Personal history of nicotine dependence: Secondary | ICD-10-CM | POA: Diagnosis not present

## 2022-05-10 DIAGNOSIS — I25119 Atherosclerotic heart disease of native coronary artery with unspecified angina pectoris: Secondary | ICD-10-CM | POA: Diagnosis not present

## 2022-05-10 DIAGNOSIS — I10 Essential (primary) hypertension: Secondary | ICD-10-CM | POA: Diagnosis not present

## 2022-05-10 HISTORY — PX: LEFT HEART CATH AND CORONARY ANGIOGRAPHY: CATH118249

## 2022-05-10 HISTORY — PX: PERCUTANEOUS CORONARY STENT INTERVENTION (PCI-S): SHX6016

## 2022-05-16 DIAGNOSIS — I10 Essential (primary) hypertension: Secondary | ICD-10-CM | POA: Diagnosis not present

## 2022-05-16 DIAGNOSIS — Z955 Presence of coronary angioplasty implant and graft: Secondary | ICD-10-CM | POA: Diagnosis not present

## 2022-05-16 DIAGNOSIS — M79631 Pain in right forearm: Secondary | ICD-10-CM | POA: Diagnosis not present

## 2022-05-16 DIAGNOSIS — I240 Acute coronary thrombosis not resulting in myocardial infarction: Secondary | ICD-10-CM | POA: Diagnosis not present

## 2022-05-20 DIAGNOSIS — I25118 Atherosclerotic heart disease of native coronary artery with other forms of angina pectoris: Secondary | ICD-10-CM | POA: Diagnosis not present

## 2022-05-20 DIAGNOSIS — Z955 Presence of coronary angioplasty implant and graft: Secondary | ICD-10-CM | POA: Diagnosis not present

## 2022-05-20 DIAGNOSIS — R29898 Other symptoms and signs involving the musculoskeletal system: Secondary | ICD-10-CM | POA: Diagnosis not present

## 2022-05-22 DIAGNOSIS — Z955 Presence of coronary angioplasty implant and graft: Secondary | ICD-10-CM | POA: Diagnosis not present

## 2022-05-22 DIAGNOSIS — I25118 Atherosclerotic heart disease of native coronary artery with other forms of angina pectoris: Secondary | ICD-10-CM | POA: Diagnosis not present

## 2022-05-22 DIAGNOSIS — R29898 Other symptoms and signs involving the musculoskeletal system: Secondary | ICD-10-CM | POA: Diagnosis not present

## 2022-05-23 DIAGNOSIS — R29898 Other symptoms and signs involving the musculoskeletal system: Secondary | ICD-10-CM | POA: Diagnosis not present

## 2022-05-23 DIAGNOSIS — Z955 Presence of coronary angioplasty implant and graft: Secondary | ICD-10-CM | POA: Diagnosis not present

## 2022-05-23 DIAGNOSIS — I25118 Atherosclerotic heart disease of native coronary artery with other forms of angina pectoris: Secondary | ICD-10-CM | POA: Diagnosis not present

## 2022-05-27 DIAGNOSIS — R29898 Other symptoms and signs involving the musculoskeletal system: Secondary | ICD-10-CM | POA: Diagnosis not present

## 2022-05-27 DIAGNOSIS — I25118 Atherosclerotic heart disease of native coronary artery with other forms of angina pectoris: Secondary | ICD-10-CM | POA: Diagnosis not present

## 2022-05-27 DIAGNOSIS — Z955 Presence of coronary angioplasty implant and graft: Secondary | ICD-10-CM | POA: Diagnosis not present

## 2022-05-29 DIAGNOSIS — I25118 Atherosclerotic heart disease of native coronary artery with other forms of angina pectoris: Secondary | ICD-10-CM | POA: Diagnosis not present

## 2022-05-29 DIAGNOSIS — R29898 Other symptoms and signs involving the musculoskeletal system: Secondary | ICD-10-CM | POA: Diagnosis not present

## 2022-05-29 DIAGNOSIS — Z955 Presence of coronary angioplasty implant and graft: Secondary | ICD-10-CM | POA: Diagnosis not present

## 2022-05-30 DIAGNOSIS — I25118 Atherosclerotic heart disease of native coronary artery with other forms of angina pectoris: Secondary | ICD-10-CM | POA: Diagnosis not present

## 2022-05-30 DIAGNOSIS — R29898 Other symptoms and signs involving the musculoskeletal system: Secondary | ICD-10-CM | POA: Diagnosis not present

## 2022-05-30 DIAGNOSIS — Z955 Presence of coronary angioplasty implant and graft: Secondary | ICD-10-CM | POA: Diagnosis not present

## 2022-06-03 DIAGNOSIS — I25118 Atherosclerotic heart disease of native coronary artery with other forms of angina pectoris: Secondary | ICD-10-CM | POA: Diagnosis not present

## 2022-06-03 DIAGNOSIS — R29898 Other symptoms and signs involving the musculoskeletal system: Secondary | ICD-10-CM | POA: Diagnosis not present

## 2022-06-03 DIAGNOSIS — Z955 Presence of coronary angioplasty implant and graft: Secondary | ICD-10-CM | POA: Diagnosis not present

## 2022-06-05 DIAGNOSIS — I25118 Atherosclerotic heart disease of native coronary artery with other forms of angina pectoris: Secondary | ICD-10-CM | POA: Diagnosis not present

## 2022-06-05 DIAGNOSIS — Z955 Presence of coronary angioplasty implant and graft: Secondary | ICD-10-CM | POA: Diagnosis not present

## 2022-06-05 DIAGNOSIS — R29898 Other symptoms and signs involving the musculoskeletal system: Secondary | ICD-10-CM | POA: Diagnosis not present

## 2022-06-06 DIAGNOSIS — Z955 Presence of coronary angioplasty implant and graft: Secondary | ICD-10-CM | POA: Diagnosis not present

## 2022-06-06 DIAGNOSIS — R29898 Other symptoms and signs involving the musculoskeletal system: Secondary | ICD-10-CM | POA: Diagnosis not present

## 2022-06-06 DIAGNOSIS — I25118 Atherosclerotic heart disease of native coronary artery with other forms of angina pectoris: Secondary | ICD-10-CM | POA: Diagnosis not present

## 2022-06-10 DIAGNOSIS — Z955 Presence of coronary angioplasty implant and graft: Secondary | ICD-10-CM | POA: Diagnosis not present

## 2022-06-10 DIAGNOSIS — R29898 Other symptoms and signs involving the musculoskeletal system: Secondary | ICD-10-CM | POA: Diagnosis not present

## 2022-06-10 DIAGNOSIS — I25118 Atherosclerotic heart disease of native coronary artery with other forms of angina pectoris: Secondary | ICD-10-CM | POA: Diagnosis not present

## 2022-06-12 DIAGNOSIS — Z955 Presence of coronary angioplasty implant and graft: Secondary | ICD-10-CM | POA: Diagnosis not present

## 2022-06-12 DIAGNOSIS — I25118 Atherosclerotic heart disease of native coronary artery with other forms of angina pectoris: Secondary | ICD-10-CM | POA: Diagnosis not present

## 2022-06-12 DIAGNOSIS — R29898 Other symptoms and signs involving the musculoskeletal system: Secondary | ICD-10-CM | POA: Diagnosis not present

## 2022-06-13 DIAGNOSIS — Z955 Presence of coronary angioplasty implant and graft: Secondary | ICD-10-CM | POA: Diagnosis not present

## 2022-06-13 DIAGNOSIS — R29898 Other symptoms and signs involving the musculoskeletal system: Secondary | ICD-10-CM | POA: Diagnosis not present

## 2022-06-13 DIAGNOSIS — I25118 Atherosclerotic heart disease of native coronary artery with other forms of angina pectoris: Secondary | ICD-10-CM | POA: Diagnosis not present

## 2022-06-17 DIAGNOSIS — Z955 Presence of coronary angioplasty implant and graft: Secondary | ICD-10-CM | POA: Diagnosis not present

## 2022-06-17 DIAGNOSIS — I25118 Atherosclerotic heart disease of native coronary artery with other forms of angina pectoris: Secondary | ICD-10-CM | POA: Diagnosis not present

## 2022-06-17 DIAGNOSIS — R29898 Other symptoms and signs involving the musculoskeletal system: Secondary | ICD-10-CM | POA: Diagnosis not present

## 2022-06-19 DIAGNOSIS — Z955 Presence of coronary angioplasty implant and graft: Secondary | ICD-10-CM | POA: Diagnosis not present

## 2022-06-19 DIAGNOSIS — R29898 Other symptoms and signs involving the musculoskeletal system: Secondary | ICD-10-CM | POA: Diagnosis not present

## 2022-06-19 DIAGNOSIS — I25118 Atherosclerotic heart disease of native coronary artery with other forms of angina pectoris: Secondary | ICD-10-CM | POA: Diagnosis not present

## 2022-06-20 DIAGNOSIS — Z955 Presence of coronary angioplasty implant and graft: Secondary | ICD-10-CM | POA: Diagnosis not present

## 2022-06-20 DIAGNOSIS — I25118 Atherosclerotic heart disease of native coronary artery with other forms of angina pectoris: Secondary | ICD-10-CM | POA: Diagnosis not present

## 2022-06-20 DIAGNOSIS — R29898 Other symptoms and signs involving the musculoskeletal system: Secondary | ICD-10-CM | POA: Diagnosis not present

## 2022-06-24 DIAGNOSIS — R29898 Other symptoms and signs involving the musculoskeletal system: Secondary | ICD-10-CM | POA: Diagnosis not present

## 2022-06-24 DIAGNOSIS — I25118 Atherosclerotic heart disease of native coronary artery with other forms of angina pectoris: Secondary | ICD-10-CM | POA: Diagnosis not present

## 2022-06-24 DIAGNOSIS — Z955 Presence of coronary angioplasty implant and graft: Secondary | ICD-10-CM | POA: Diagnosis not present

## 2022-06-26 DIAGNOSIS — R29898 Other symptoms and signs involving the musculoskeletal system: Secondary | ICD-10-CM | POA: Diagnosis not present

## 2022-06-26 DIAGNOSIS — Z955 Presence of coronary angioplasty implant and graft: Secondary | ICD-10-CM | POA: Diagnosis not present

## 2022-06-26 DIAGNOSIS — I25118 Atherosclerotic heart disease of native coronary artery with other forms of angina pectoris: Secondary | ICD-10-CM | POA: Diagnosis not present

## 2022-06-27 DIAGNOSIS — R29898 Other symptoms and signs involving the musculoskeletal system: Secondary | ICD-10-CM | POA: Diagnosis not present

## 2022-06-27 DIAGNOSIS — I25118 Atherosclerotic heart disease of native coronary artery with other forms of angina pectoris: Secondary | ICD-10-CM | POA: Diagnosis not present

## 2022-06-27 DIAGNOSIS — Z955 Presence of coronary angioplasty implant and graft: Secondary | ICD-10-CM | POA: Diagnosis not present

## 2022-07-08 DIAGNOSIS — E782 Mixed hyperlipidemia: Secondary | ICD-10-CM | POA: Diagnosis not present

## 2022-07-08 DIAGNOSIS — E119 Type 2 diabetes mellitus without complications: Secondary | ICD-10-CM | POA: Diagnosis not present

## 2022-07-08 DIAGNOSIS — K219 Gastro-esophageal reflux disease without esophagitis: Secondary | ICD-10-CM | POA: Diagnosis not present

## 2022-07-08 DIAGNOSIS — I10 Essential (primary) hypertension: Secondary | ICD-10-CM | POA: Diagnosis not present

## 2022-08-05 ENCOUNTER — Other Ambulatory Visit: Payer: Self-pay | Admitting: *Deleted

## 2022-08-05 ENCOUNTER — Ambulatory Visit: Payer: BC Managed Care – PPO | Attending: Cardiology | Admitting: Cardiology

## 2022-08-05 VITALS — BP 130/67 | HR 76 | Ht 68.0 in | Wt 247.4 lb

## 2022-08-05 DIAGNOSIS — Z9861 Coronary angioplasty status: Secondary | ICD-10-CM

## 2022-08-05 DIAGNOSIS — E781 Pure hyperglyceridemia: Secondary | ICD-10-CM

## 2022-08-05 DIAGNOSIS — I2510 Atherosclerotic heart disease of native coronary artery without angina pectoris: Secondary | ICD-10-CM | POA: Diagnosis not present

## 2022-08-05 DIAGNOSIS — E783 Hyperchylomicronemia: Secondary | ICD-10-CM

## 2022-08-05 DIAGNOSIS — Z6835 Body mass index (BMI) 35.0-35.9, adult: Secondary | ICD-10-CM

## 2022-08-05 DIAGNOSIS — E785 Hyperlipidemia, unspecified: Secondary | ICD-10-CM | POA: Diagnosis not present

## 2022-08-05 DIAGNOSIS — I10 Essential (primary) hypertension: Secondary | ICD-10-CM

## 2022-08-05 MED ORDER — FENOFIBRATE 54 MG PO TABS: 54.0000 mg | ORAL_TABLET | Freq: Every day | ORAL | 3 refills | Status: AC

## 2022-08-05 NOTE — Progress Notes (Signed)
Primary Care Provider: Earlyne Iba, NP -Earlyne Iba, NP [place] Cardiologist: Pixie Casino, MD ?Referred for Hypertriglyceridemia.  Cardiologist: Dr. Caroline More [facility] ([place], [place]) Interventional Cardiologist: Dr. Kerry Dory [facility]  Clinic Note: Chief Complaint  Patient presents with   New Patient (Initial Visit)    Referred to [facility] for Hypertriglyceridemia => However Was Scheduled to See an Interventional Cardiologist as opposed to Dr. Debara Pickett from the lipid clinic)   Coronary Artery Disease    Significant CAD reviewed below.  Thankfully, no more angina since PCI.   Dyslipidemia    Low HDL, high triglycerides--recently has gone up dramatically despite being on Vascepa, and statin -> notably better lipid panel in July 2023 been in November.   ===================================  ASSESSMENT/PLAN   Problem List Items Addressed This Visit       Cardiology Problems   CAD S/P percutaneous coronary angioplasty (Chronic)    Significant disease with lesions in the LAD and LCx treated with DES stents.  Also apical LAD TIMI II flow with significant lesion that was treated medically.  This was evaluated with a GXT prior to her cardiac rehab and had no ischemic changes with no angina.  He was able to reach 10.2 METS.  All, he is feeling better from a angina standpoint.  He still has some exertional dyspnea but is obese and deconditioned.  Now that he is not having angina, he hopes to build up his exercise tolerance.  Significant risk factors of hypertension, atherogenic dyslipidemia/hypertriglyceridemia, DM2 and obesity (Metabolic Syndrome)  Plan: DAPT with ASA and Plavix-likely long-term will defer to primary cardiologist Was on 60 mg Imdur, reduced to 30 mg primary cardiologist-no longer having angina, after PCI. Was started on Toprol after initial PCI for recurrent chest pain. On combination of  losartan-amlodipine-HCTZ On metformin, but not SGLT2 inhibitor or GLP-1 agonist. => Has been referred to Endocrinology [facility] Endocrinology as opposed to [facility]?) On modest dose atorvastatin plus Vascepa => adding fenofibrate and referring to Dr. Wonda Horner clinic.        Relevant Medications   isosorbide mononitrate (IMDUR) 30 MG 24 hr tablet   metoprolol succinate (TOPROL-XL) 25 MG 24 hr tablet   CVS ASPIRIN LOW DOSE 81 MG tablet   atorvastatin (LIPITOR) 40 MG tablet   VASCEPA 1 g capsule   Olmesartan-amLODIPine-HCTZ 40-10-12.5 MG TABS   fenofibrate 54 MG tablet   Essential hypertension (Chronic)   Relevant Medications   isosorbide mononitrate (IMDUR) 30 MG 24 hr tablet   metoprolol succinate (TOPROL-XL) 25 MG 24 hr tablet   CVS ASPIRIN LOW DOSE 81 MG tablet   atorvastatin (LIPITOR) 40 MG tablet   VASCEPA 1 g capsule   Olmesartan-amLODIPine-HCTZ 40-10-12.5 MG TABS   fenofibrate 54 MG tablet   Hypertriglyceridemia, sporadic - Primary (Chronic)    Interestingly, triglycerides going up correlates with his A1c increasing from 8-11.1, his triglycerides went up from the 400s to over thousand.  I spent suspect that there is some dietary component but also clearly familial component as well.  He is already on Vascepa, in fact that was started with better triglyceride levels and recent levels.  For now continue Lipitor 40 mg, Vascepa 2 g twice daily and add fenofibrate 57 mg-check lipids in January Refer to [facility]      Relevant Medications   isosorbide mononitrate (IMDUR) 30 MG 24 hr tablet   metoprolol succinate (TOPROL-XL) 25  MG 24 hr tablet   CVS ASPIRIN LOW DOSE 81 MG tablet   atorvastatin (LIPITOR) 40 MG tablet   VASCEPA 1 g capsule   Olmesartan-amLODIPine-HCTZ 40-10-12.5 MG TABS   fenofibrate 54 MG tablet   Other Relevant Orders   EKG 12-Lead (Completed)   AMB Referral to Advanced Lipid Disorders Clinic   Hepatic function panel    Lipid panel   Hemoglobin A1c     Other   Atherogenic dyslipidemia (Chronic)    Check lipids and liver tests in January after starting fenofibrate.  We will then need to recheck shortly after. I have placed referral to Dr. [facility], and recommend that he indeed follows up with endocrinology whether the endocrinologist is a at [facility], or [facility]  (Patient needs to determine if he wants to stay with [facility], or with [facility] since he is established With a Cabin crew. (He indicated that he would be interested in meeting Dr. Debara Pickett)      Relevant Medications   atorvastatin (LIPITOR) 40 MG tablet   VASCEPA 1 g capsule   fenofibrate 54 MG tablet   Obesity due to excess calories (Chronic)    Clearly a playing a problem as part of the combination respecters of METABOLIC SYNDROME.  The patient understands the need to lose weight with diet and exercise. We have discussed specific strategies for this.  Would probably benefit from GLP-1 agonist-defer to Lipid Clinic/Endocrinology      Relevant Medications   metFORMIN (GLUCOPHAGE) 500 MG tablet   glipiZIDE (GLUCOTROL XL) 5 MG 24 hr tablet    Sean Randall is a very pleasant gentleman who has an existing cardiologist through [facility] in [place] (along with the Interventional Cardiologist at [facility]).  He has voiced an interest in maintaining his relationship with his primary cardiologist, and therefore does not need an Interventional Cardiologist from a different group.  The intention was referral for lipid management and I will therefore forward him to our resident Lipidologist: Dr. Debara Pickett, who happens to be in the same office. I spent a significant time reviewing the patient's chart including multiple studies, clinic and hospital notes.  I have updated the [facility] medical record.  ===================================  HPI:    Sean Randall is a ~  Morbidly Obese (BMI ~38 with multiple RFs) 46 y.o. male with recently diagnosed CAD-PCI (LAD & LCx for persistent atypical angina - w/ abnormal Nuclear ST), Significant Dyslipidemia (HyperTG with Low HDL), DM-2 (A1c ~11 - erratic, had been 8.0 this summer),  HTN, hepatic steatosis, and CKD 3A.  Is being seen today for the evaluation of Dyslipidemia with Hypertriglyceridemia at the request of Earlyne Iba, NP.  03/22/22: PCI to mid LAD 80% - Synergy DES 3.5 x 16 - post-dilated to 4.0 mm (Dr. Denman George) 05/10/22: Staged PCI to LCx  - Synergy DES 3.0 x 20 - deployted to 3.3 mm (Dr.Rossi)  Janora Norlander was initially seen by Dr. Dot Lanes for consultation of atypical chest pain on February 19, 2022.  He had several ER visits for chest pain at [facility] ER.  I had noted markedly elevated blood pressures in the ER.  Because of risk factors, he was referred for echo and nuclear stress test.  (Reviewed below).  His most recent visit was on June 10, 2022 to follow-up after GXT.  He was doing well.  Doing cardiac rehab and was back playing softball with no further chest pain episodes.  His right forearm has been somewhat sore post PCI, but was doing better.  Less bruising.  He was asking about going back to work.  Release letter provided.  But noted to have some AKI and LFT abnormalities post cath, creatinine improved.  RUQ showed no abnormalities.  Was maintained on ASA/Plavix, amlodipine, metoprolol and increased dose of Imdur to 60 mg.  On 80 mg atorvastatin with no mention of severely elevated triglycerides..  (Lipids as of July 2023 had total cholesterol 159, TG 09/12/2018, HDL 32 and LDL 62.  A1c was also 8.0).  Recent Hospitalizations:  February 11, 2022 [facility] ER for "atypical chest pain" and hypertension-referred to [place]  March 08, 2022-chest pain again (on the day of his Myoview. Follow-up with Dr.Aghajanian on July 6 after stress test-patient just did not look like he felt well.  Ischemic CAD thought to be low  risk but with multiple presentations with chest pain on stress test => referred for cardiac catheterization 03/22/2022-[facility] -> LAD PCI 03/26/2022-another ER visit for chest pain- Cardiology follow-up on 03/29/2022: Apparently not similar to his previous pain. => Started metoprolol 04/22/2022: Another ER visit for chest pain this time described as severe indigestion.  But not associated with food intake.   Follow-up with Dr. Dot Lanes 04/26/2022: Patient was feeling worn out fatigue concerned about LCx lesion.  Also had creatinine of 1.6 and abnormal LFTs. => Referred back for staged PCI of LCx. 05/10/2022-staged PCI of the LCx Enrolled in cardiac rehab with a GGT/GXT stress test ordered  Reviewed  CV studies:    The following studies were reviewed today: (if available, images/films reviewed: From Epic Chart or Care Everywhere) [place] updated  Echo 02/11/2022 moderate LVH, normal EF ~60%, grade 3 DD-severely elevated LAP (moderate LA dilation). Normal RV, mild AI  SPECT Stress 03/08/2022:- The patient exercised under supervision for a total of 9 min(s), 0 sec(s) achieving stage 3 (10.2 METS) of the Bruce protocol. Chest discomfort was reported in recovery. No significant ST depression or TW inversion was noted.  Frequent PVC's occurred at peak stress with resolution in recovery; - Hypertensive response to exercise with a peak BP of 225/110 ==> No significant ischemia is noted on perfusion imaging; - There is a small in size, moderate in severity, fixed defect involving the basal inferior segment. This is consistent with probable artifact (diaphragmatic attenuation). Post stress: The ejection fraction calculated at 65%.  -  LHC 03/22/22 ([facility]-Dr. Denman George): - Severe 2v CAD including 90% LAD and 80% distal LCx: Codominant: Large LAD - mLAD 90%, dLAD moderate "irregularity", sm-mod D1 moderate dz, Lg D2 -mild dz, sm-mod D2 mild. Large LCx - dCx 80%, sm-mod OM1, Lg OM2, sm OM3; Ectatic RCA, mid RCA  40-50% ~ ulcerated, sm-mod PDA & PL1 - mild dz. - LVEDP 12 Successful PCI to the mid LAD 90% stenosis with a Synergy 3.50x16 mm stent (Required Scoreflex & [place] balloon - post-dialted to 4.0 mm)   RUQ Ultrasound 04/29/2022 [facility]): No gallstones or biliary dilation.  Partially visualized liver suggests hepatic steatosis-previous identified.  LHC 05/10/22: ([facility]-Dr. Denman George) patent mLAD stent, PROGRESSION of dLAD up to 80% TIMI 2 flow (sm-mod - not PCI favorable). D1 50%. Mid LCx 50%, dLCx 80% "Staged" PCI of dLCx : Synergy DES 3 x 20 mm -> 3.3 mm   ETT/GXT 05/27/2022: Exercised 13 min, 4 sec.  10.10 METS.  130 bpm Max HR -> 34% PMHR. Negative for EKG changes, No CP/Angina.  LOW RISK.   Interval  History:   Jaziah Rosche presents here today for lipid referral at the request of his PCP.  As an interventional cardiologist, I reviewed his chart as detailed above.  Thankfully, Garan has done relatively well since his PCI is more no further chest pain or pressure.  He still has some exertional dyspnea from baseline deconditioning and obesity.  He is back to work and overall feeling better.  He seems to be pretty happy with his cardiologist after finally getting treated.  He was somewhat frustrated at how long it took to get definitive therapy but now is happy.  He is doing relatively well with cardiac rehab.  He says that he commutes to [facility], and therefore it is not too far out of the way to come to [place] however he still has been established with a Film/video editor at [facility].  Interestingly, the referral was not made back to [facility] when his lipid panel from November came back is grossly abnormal when compared to July 2023.  CV Review of Symptoms (Summary): positive for - dyspnea on exertion and mild exercise intolerance due to deconditioning. negative for - chest pain, edema, irregular heartbeat, orthopnea, palpitations, paroxysmal nocturnal dyspnea, rapid heart rate, or syncope or near syncope,  TIA/amaurosis fugax or claudication.  REVIEWED OF SYSTEMS   Review of Systems  Constitutional:  Positive for malaise/fatigue (Notably improved since PCI, still gets tired easily.-Is a heavy Company secretary.).  Respiratory:  Positive for shortness of breath (Stable exertional dyspnea that he had well before his PCI and onset of chest discomfort.).   Cardiovascular:  Negative for chest pain.  Gastrointestinal:  Negative for blood in stool, heartburn and melena.  Genitourinary:  Negative for hematuria.  Musculoskeletal:  Positive for joint pain. Negative for myalgias.  Neurological:  Positive for tingling (Left hand-most notably on the third finger.).  Endo/Heme/Allergies:  Bruises/bleeds easily.  Psychiatric/Behavioral:  Negative for depression and memory loss. The patient does not have insomnia.     I have reviewed and (if needed) personally updated the patient's problem list, medications, allergies, past medical and surgical history, social and family history.   PAST MEDICAL HISTORY   Past Medical History:  Diagnosis Date   Anxiety    Atherogenic dyslipidemia    Hi LDL low HDL, high TG -> complicated by   CAD S/P percutaneous coronary angioplasty 03/2022   Multiple episodes of chest pain-equivocal Myoview: Cardiac cath July 2023 revealed 90% mid LAD, 80% distal LCx => initial PCI of LAD staged PCI to LCx 05/10/2022-at the time also noted to have 80% distal LAD with TIMI moderate disease in RCA, D1 and proximal LCx.   ED (erectile dysfunction) of organic origin    Essential hypertension    GERD (gastroesophageal reflux disease)    Hypertriglyceridemia, sporadic    Triglycerides anywhere from 300->1100   Hypogonadism male    Obesity due to excess calories    BMI almost 38 with HTN, HLD, high TG and low HDL and DM-2   Tear of MCL (medial collateral ligament) of knee, right, sequela    Type II diabetes mellitus with complication ([place])    Most recent A1c 11.1-has CAD, obesity, HTN  and atherogenic dyslipidemia (high TG and low HDL)    PAST SURGICAL HISTORY   Past Surgical History:  Procedure Laterality Date   HERNIA REPAIR  2016   LAPAROSCOPIC APPENDECTOMY  2000   LEFT HEART CATH AND CORONARY ANGIOGRAPHY  03/22/2012   ([facility]-Dr. Denman George): - Severe 2v CAD including 90% LAD and  80% distal LCx: Codominant: Large LAD - mLAD 90%, dLAD moderate "irregularity", sm-mod D1 moderate dz, Lg D2 -mild dz, sm-mod D2 mild. Large LCx - dCx 80%, sm-mod OM1, Lg OM2, sm OM3; Ectatic RCA, mid RCA 40-50% ~ ulcerated, sm-mod PDA & PL1 - mild dz.   LEFT HEART CATH AND CORONARY ANGIOGRAPHY  05/10/2022   [facility]-Dr. Andrey Farmer) patent mLAD stent, PROGRESSION of dLAD up to 80% TIMI 2 flow (sm-mod - not PCI favorable). D1 50%. Mid LCx 50%, dLCx 80%   Nuclear Stress Test  03/08/2022   [facility]: Bruce protocol 9:00 -> 10.2 METS.  Frequent PVCs at peak stress and resolving recover.  Chest risk and recovery.  No ST changes. HTN response with peak BP 225/110 mmHg. small size, moderate severity fixed defect involving basal inferior segment likely c/w diaphragmatic attenuation-NO ISCHEMIA or INFARCTION.  EF 65%   PERCUTANEOUS CORONARY STENT INTERVENTION (PCI-S)  03/22/2022   ([facility]-Dr. Andrey Farmer): Successful PCI to the mid LAD 90% stenosis with a Synergy 3.50x16 mm stent (Required Scoreflex & [place] balloon - post-dialted to 4.0 mm)   PERCUTANEOUS CORONARY STENT INTERVENTION (PCI-S)  05/10/2022   ([facility]-Dr. Andrey Farmer): "Staged" PCI of dLCx : Synergy DES 3 x 20 mm -> 3.3 mm   TRANSTHORACIC ECHOCARDIOGRAM  02/11/2022   [facility]iology: moderate LVH, normal EF ~60%, grade 3 DD-severely elevated LAP (moderate LA dilation). Normal RV, mild AI     There is no immunization history on file for this patient.  MEDICATIONS/ALLERGIES   Current Meds  Medication Sig   atorvastatin (LIPITOR) 40 MG tablet Take 40 mg by mouth daily.   clopidogrel (PLAVIX) 75 MG tablet Take 75 mg by mouth daily.    CVS ASPIRIN LOW DOSE 81 MG tablet Take 81 mg by mouth daily.   fenofibrate 54 MG tablet Take 1 tablet (54 mg total) by mouth daily.   glipiZIDE (GLUCOTROL XL) 5 MG 24 hr tablet Take 5 mg by mouth every morning.   isosorbide mononitrate (IMDUR) 30 MG 24 hr tablet Take 30 mg by mouth daily.   metFORMIN (GLUCOPHAGE) 500 MG tablet Take 1,000 mg by mouth 2 (two) times daily.   metoprolol succinate (TOPROL-XL) 25 MG 24 hr tablet Take 50 mg by mouth daily.   Olmesartan-amLODIPine-HCTZ 40-10-12.5 MG TABS Take 1 tablet by mouth daily.   omeprazole (PRILOSEC) 40 MG capsule Take 1 capsule by mouth daily.   VASCEPA 1 g capsule Take 2 g by mouth 2 (two) times daily.    Allergies  Allergen Reactions   Promethazine Other (See Comments)    Patient states "makes me floppy"  jerking    SOCIAL HISTORY/FAMILY HISTORY   Reviewed in Epic:   Social History   Tobacco Use   Smoking status: Never   Smokeless tobacco: Current    Types: Snuff  Substance Use Topics   Alcohol use: Yes    Comment: Occasional when he is not having to work.   Drug use: Not Currently   Social History   Social History Narrative   Not on file   Family History  Problem Relation Age of Onset   Diabetes Mellitus II Mother    Hyperlipidemia Mother    Diabetes Mellitus II Father    Coronary artery disease Father 73       PCI   Hypertension Father    Hyperlipidemia Father    Heart failure Father    COPD Father        Smoker   Heart attack  Father     OBJCTIVE -PE, EKG, labs   Wt Readings from Last 3 Encounters:  08/05/22 247 lb 6.4 oz (112.2 kg)  Wt Readings from Last 3 Encounters: At primary cardiologist office 06/10/22 (!) 112.9 kg (248 lb 12.8 oz)  06/10/22 (!) 111.6 kg (246 lb)  06/06/22 (!) 111.6 kg (246 lb)   Physical Exam: BP 130/67   Pulse 76   Ht 5\' 8"  (1.727 m)   Wt 247 lb 6.4 oz (112.2 kg)   SpO2 96%   BMI 37.62 kg/m  Physical Exam Vitals reviewed.  Constitutional:      General: He is not in  acute distress.    Appearance: Normal appearance. He is obese. He is not ill-appearing or toxic-appearing.     Comments: Well-groomed.  HENT:     Head: Normocephalic and atraumatic.  Neck:     Vascular: No carotid bruit or JVD (Difficult to assess).  Cardiovascular:     Rate and Rhythm: Normal rate and regular rhythm. No extrasystoles are present.    Chest Wall: PMI is not displaced (Difficult to palpate).     Pulses: Normal pulses and intact distal pulses.     Heart sounds: S1 normal and S2 normal. Heart sounds are distant. No murmur heard.    No friction rub. No gallop.  Pulmonary:     Effort: Pulmonary effort is normal. No respiratory distress.     Breath sounds: Normal breath sounds. No wheezing, rhonchi or rales.  Chest:     Chest wall: No tenderness.  Abdominal:     General: Bowel sounds are normal. There is no distension.     Palpations: Abdomen is soft.     Tenderness: There is no abdominal tenderness. There is no guarding.     Comments: Truncal obesity.  No HSM or bruit.  Musculoskeletal:        General: No swelling. Normal range of motion.     Cervical back: Normal range of motion and neck supple.  Skin:    General: Skin is warm and dry.     Comments: Somewhat ruddy complexion.  Neurological:     General: No focal deficit present.     Mental Status: He is alert and oriented to person, place, and time.     Gait: Gait normal.  Psychiatric:        Behavior: Behavior normal.        Thought Content: Thought content normal.        Judgment: Judgment normal.     Comments: Seems little frustrated, was frustrated about how long it took him to get to the Cath Lab and then back again, but has no indication of desire to leave his current cardiologist      Adult ECG Report-today  Rate: 76;  Rhythm: normal sinus rhythm and left atrial enlargement, inferior Mikan age-indeterminate. ;   Narrative Interpretation: Abnormal, but stable axis no longer right.  (80 )  From PCPs  office 02/27/2022: Rate: 87;  Rhythm: normal sinus rhythm and possible left atrial abnormality, right axis deviation (120 ).  Cannot exclude inferior MI, age-indeterminate.;   Narrative Interpretation: Abnormal  Recent Labs: Seen in Care Everywhere. 04/03/2022 TC 159, TG 420, HDL 32, LDL 62; A1c 8.0 => started on Vascepa and increased dose of Trulicity 99991111 Na+ 132, K+ 4.2, Cl- 95, HCO3-23, BUN 51, Cr 1.1, Glu 220*, Ca2+ 9.2; AST 60*, ALT 98*, AlkP 86 RUQ ultrasound ordered for elevated LFTs, CBC: W 4.9, H/H 15.4/45.6, Plt 151;  A1c 11.1 TC 297, TG 1149, HDL 26, LDL **unable to calculate due to high TG ================================================== I spent a total of 38 minutes with the patient spent in direct patient consultation.  Additional time spent with chart review  / charting (studies, outside notes, etc): 64 min Total Time: 102 min  Current medicines are reviewed at length with the patient today.  (+/- concerns) N/A  Notice: This dictation was prepared with Dragon dictation along with smart phrase technology. Any transcriptional errors that result from this process are unintentional and may not be corrected upon review.   Studies Ordered:  Orders Placed This Encounter  Procedures   Hepatic function panel   Lipid panel   Hemoglobin A1c   AMB Referral to Advanced Lipid Disorders Clinic   EKG 12-Lead   Meds ordered this encounter  Medications   fenofibrate 54 MG tablet    Sig: Take 1 tablet (54 mg total) by mouth daily.    Dispense:  90 tablet    Refill:  3    Patient Instructions / Medication Changes & Studies & Tests Ordered   Patient Instructions  Medication Instructions:   Start fenofibrate 57 mg one tablet daily  *If you need a refill on your cardiac medications before your next appointment, please call your pharmacy*  Other Instructions    Follow up with Your Endocrinologist    Lab Work:fasting Lipid Liver - Jan 2024 If you have labs (blood  work) drawn today and your tests are completely normal, you will receive your results only by: [place] (if you have MyChart) OR A paper copy in the mail If you have any lab test that is abnormal or we need to change your treatment, we will call you to review the results.   Testing/Procedures:  Not needed  Follow-Up: At [facility], you and your health needs are our priority.  As part of our continuing mission to provide you with exceptional heart care, we have created designated Provider Care Teams.  These Care Teams include your primary Cardiologist (physician) and Advanced Practice Providers (APPs -  Physician Assistants and Nurse Practitioners) who all work together to provide you with the care you need, when you need it.     Your next appointment:   2 month(s)  The format for your next appointment:   In Person  Provider:   Pixie Casino, MD    Other Instructions    Follow up with Your Endocrinologist     Leonie Man, MD, MS Glenetta Hew, M.D., M.S. Interventional Cardiologist  [place]  Pager # [phone] Phone # [phone] [address]. [place], [place] [address]   Thank you for choosing [place] at [place]!!

## 2022-08-05 NOTE — Patient Instructions (Addendum)
Medication Instructions:   Start fenofibrate 57 mg one tablet daily  *If you need a refill on your cardiac medications before your next appointment, please call your pharmacy*  Other Instructions    Follow up with Your Endocrinologist    Lab Work:fasting Lipid Liver - Jan 2024 If you have labs (blood work) drawn today and your tests are completely normal, you will receive your results only by: MyChart Message (if you have MyChart) OR A paper copy in the mail If you have any lab test that is abnormal or we need to change your treatment, we will call you to review the results.   Testing/Procedures:  Not needed  Follow-Up: At [facility], you and your health needs are our priority.  As part of our continuing mission to provide you with exceptional heart care, we have created designated Provider Care Teams.  These Care Teams include your primary Cardiologist (physician) and Advanced Practice Providers (APPs -  Physician Assistants and Nurse Practitioners) who all work together to provide you with the care you need, when you need it.     Your next appointment:   2 month(s)  The format for your next appointment:   In Person  Provider:   Chrystie Nose, MD    Other Instructions    Follow up with Your Endocrinologist

## 2022-08-05 NOTE — Progress Notes (Deleted)
Cardiac Studies  Echo 02/11/2022  1. The left ventricle is normal in size with moderately increased wall  thickness.   2. The left ventricular systolic function is normal, LVEF is visually  estimated at 60%.    3. There is grade III diastolic dysfunction (severely elevated filling  pressure).   4. The right ventricle is normal in size, with normal systolic function.    5. The left atrium is moderately dilated in size.    6. There is mild aortic regurgitation.    Myoview 6/30/82023 Impressions:  - No significant ischemia is noted on perfusion imaging  - There is a small in size, moderate in severity, fixed defect involving  the basal inferior segment.  This is consistent with probable artifact  (diaphragmatic attenuation).  - Post stress:  The ejection fraction calculated at 65%.  - The patient exercised under supervision for a total of 9 min(s), 0  sec(s) achieving stage 3 (10.2 METS) of the Bruce protocol. Chest  discomfort was reported in recovery. No significant ST depression or TW  inversion was noted.  - Frequent PVC's occurred at peak stress with resolution in recovery  - Hypertensive response to exercise with a peak BP of 225/110  - Sensitivity and specificity of this test are reduced by the noted  attenuation and BMI    CAth-PCI to LAD 03/2022 : Synergy DES 3.5 x 16   Cath-PCI 05/10/2022: DIAGNOSTIC FINDINGS:  Coronary Angiography:  - Dominance: co-dominant   Coronary Findings:  Left Main:  - Ostial Left Main: No significant stenosis (large caliber)  - Left Main: No significant stenosis (large caliber)   Left Anterior Descending:  - Ostial LAD: No significant stenosis (large caliber)  - Proximal LAD: No significant stenosis (large caliber)  - Mid LAD: (moderate caliber, patent stent)  - Distal LAD: severe diffuse disease up to 80% (small to moderate caliber)  - First diagonal: moderate irregularity up to 50% (moderate caliber)  - Second diagonal: No significant  stenosis (small caliber)  - Third diagonal: No significant stenosis (small caliber)   Left Circumflex:  - Ostial LCx: No significant stenosis (large caliber)  - Proximal LCx: mild irregularity up to 25% (large caliber)  - Mid LCx: moderate irregularity up to 50% (moderate to large caliber)  - Distal LCx: severe diffuse disease up to 80% (moderate to large caliber)  => DES PCI Synergy DES 3 x 20 (3.1 mm) - OM1: No significant stenosis (small caliber)  - OM2: No significant stenosis (large caliber)  - OM3: No significant stenosis (small caliber)   Right coronary artery:  - Ostial RCA: No significant stenosis  - Proximal RCA: No significant stenosis  - Mid RCA: No significant stenosis  - Distal RCA: No significant stenosis  - PDA: No significant stenosis  - RCA continuation: No significant stenosis  - PL1: No significant stenosis  - PL2: No significant stenosis    Left Ventriculogram:  - Left ventriculogram was not performed.   Hemodynamics:  BP / Ao (mmHg): 113/82  Mean: 92   Left Heart:  LVEDP (mmHg): 20  LV: (pre-A): 10   There was no significant gradient across the aortic valve.     GXT 05/27/2022 Exercise duration: 4 seconds  METS: 10.10  Exercise termination reason:  fatigue, leg discomfort  Resting measurements:  BP = 134/82, HR = 82 bpm  Peak measurements:  BP = 180/88, HR = 130 bpm (74% PMHR)  -> Impression:  -  No exercise induced chest discomfort  at an estimated workload of 10.10 METS on Modified Bruce protocol. Exercise was stopped due to fatigue and leg discomfort  -  Normal ECG, rhythm and BP response to exercise at 74% of the age-predicted maximal HR.

## 2022-08-11 ENCOUNTER — Encounter: Payer: Self-pay | Admitting: Cardiology

## 2022-08-11 DIAGNOSIS — I10 Essential (primary) hypertension: Secondary | ICD-10-CM | POA: Insufficient documentation

## 2022-08-11 DIAGNOSIS — E783 Hyperchylomicronemia: Secondary | ICD-10-CM | POA: Insufficient documentation

## 2022-08-11 DIAGNOSIS — E785 Hyperlipidemia, unspecified: Secondary | ICD-10-CM | POA: Insufficient documentation

## 2022-08-11 DIAGNOSIS — E6609 Other obesity due to excess calories: Secondary | ICD-10-CM | POA: Insufficient documentation

## 2022-08-11 NOTE — Assessment & Plan Note (Signed)
Check lipids and liver tests in January after starting fenofibrate.  We will then need to recheck shortly after. I have placed referral to Dr. Lil[facility], and recommend that he indeed follows up with endocrinology whether the endocrinologist is a at [facility], or [facility]  (Patient needs to determine if he wants to stay with [facility], or with [facility] since he is established With a Office manager. (He indicated that he would be interested in meeting Dr. Rennis Golden)

## 2022-08-11 NOTE — Assessment & Plan Note (Signed)
Significant disease with lesions in the LAD and LCx treated with DES stents.  Also apical LAD TIMI II flow with significant lesion that was treated medically.  This was evaluated with a GXT prior to her cardiac rehab and had no ischemic changes with no angina.  He was able to reach 10.2 METS.  All, he is feeling better from a angina standpoint.  He still has some exertional dyspnea but is obese and deconditioned.  Now that he is not having angina, he hopes to build up his exercise tolerance.  Significant risk factors of hypertension, atherogenic dyslipidemia/hypertriglyceridemia, DM2 and obesity (Metabolic Syndrome)  Plan: DAPT with ASA and Plavix-likely long-term will defer to primary cardiologist Was on 60 mg Imdur, reduced to 30 mg primary cardiologist-no longer having angina, after PCI. Was started on Toprol after initial PCI for recurrent chest pain. On combination of losartan-amlodipine-HCTZ On metformin, but not SGLT2 inhibitor or GLP-1 agonist. => Has been referred to Endocrinology [facility] as opposed to [facility]?) On modest dose atorvastatin plus Vascepa => adding fenofibrate and referring to Dr. Valera [facility] clinic.

## 2022-08-11 NOTE — Assessment & Plan Note (Signed)
Clearly a playing a problem as part of the combination respecters of METABOLIC SYNDROME.  The patient understands the need to lose weight with diet and exercise. We have discussed specific strategies for this.  Would probably benefit from GLP-1 agonist-defer to Lipid Clinic/Endocrinology

## 2022-08-11 NOTE — Assessment & Plan Note (Signed)
Interestingly, triglycerides going up correlates with his A1c increasing from 8-11.1, his triglycerides went up from the 400s to over thousand.  I spent suspect that there is some dietary component but also clearly familial component as well.  He is already on Vascepa, in fact that was started with better triglyceride levels and recent levels.  For now continue Lipitor 40 mg, Vascepa 2 g twice daily and add fenofibrate 57 mg-check lipids in January Refer to Dr. [facility]

## 2022-09-30 ENCOUNTER — Ambulatory Visit: Payer: BC Managed Care – PPO | Admitting: Internal Medicine

## 2022-09-30 ENCOUNTER — Encounter: Payer: Self-pay | Admitting: Internal Medicine

## 2022-10-14 DIAGNOSIS — E1129 Type 2 diabetes mellitus with other diabetic kidney complication: Secondary | ICD-10-CM | POA: Diagnosis not present

## 2022-10-14 DIAGNOSIS — I10 Essential (primary) hypertension: Secondary | ICD-10-CM | POA: Diagnosis not present

## 2022-10-14 DIAGNOSIS — E1165 Type 2 diabetes mellitus with hyperglycemia: Secondary | ICD-10-CM | POA: Diagnosis not present

## 2022-10-14 DIAGNOSIS — K219 Gastro-esophageal reflux disease without esophagitis: Secondary | ICD-10-CM | POA: Diagnosis not present

## 2022-10-14 DIAGNOSIS — I5030 Unspecified diastolic (congestive) heart failure: Secondary | ICD-10-CM | POA: Diagnosis not present

## 2022-10-14 DIAGNOSIS — R809 Proteinuria, unspecified: Secondary | ICD-10-CM | POA: Diagnosis not present

## 2022-10-14 DIAGNOSIS — E1159 Type 2 diabetes mellitus with other circulatory complications: Secondary | ICD-10-CM | POA: Diagnosis not present

## 2022-10-14 DIAGNOSIS — E291 Testicular hypofunction: Secondary | ICD-10-CM | POA: Diagnosis not present

## 2022-10-14 DIAGNOSIS — E782 Mixed hyperlipidemia: Secondary | ICD-10-CM | POA: Diagnosis not present

## 2022-10-30 DIAGNOSIS — E291 Testicular hypofunction: Secondary | ICD-10-CM | POA: Diagnosis not present

## 2022-11-07 ENCOUNTER — Ambulatory Visit: Payer: BC Managed Care – PPO | Attending: Internal Medicine | Admitting: Internal Medicine

## 2023-01-03 DIAGNOSIS — Z7985 Long-term (current) use of injectable non-insulin antidiabetic drugs: Secondary | ICD-10-CM | POA: Diagnosis not present

## 2023-01-03 DIAGNOSIS — E1169 Type 2 diabetes mellitus with other specified complication: Secondary | ICD-10-CM | POA: Diagnosis not present

## 2023-01-03 DIAGNOSIS — R809 Proteinuria, unspecified: Secondary | ICD-10-CM | POA: Diagnosis not present

## 2023-01-03 DIAGNOSIS — Z7984 Long term (current) use of oral hypoglycemic drugs: Secondary | ICD-10-CM | POA: Diagnosis not present

## 2023-01-13 DIAGNOSIS — I10 Essential (primary) hypertension: Secondary | ICD-10-CM | POA: Diagnosis not present

## 2023-01-13 DIAGNOSIS — E119 Type 2 diabetes mellitus without complications: Secondary | ICD-10-CM | POA: Diagnosis not present

## 2023-01-13 DIAGNOSIS — R0789 Other chest pain: Secondary | ICD-10-CM | POA: Diagnosis not present

## 2023-01-13 DIAGNOSIS — Z7982 Long term (current) use of aspirin: Secondary | ICD-10-CM | POA: Diagnosis not present

## 2023-01-13 DIAGNOSIS — Z888 Allergy status to other drugs, medicaments and biological substances status: Secondary | ICD-10-CM | POA: Diagnosis not present

## 2023-01-13 DIAGNOSIS — Z79899 Other long term (current) drug therapy: Secondary | ICD-10-CM | POA: Diagnosis not present

## 2023-01-13 DIAGNOSIS — R079 Chest pain, unspecified: Secondary | ICD-10-CM | POA: Diagnosis not present

## 2023-01-13 DIAGNOSIS — I2510 Atherosclerotic heart disease of native coronary artery without angina pectoris: Secondary | ICD-10-CM | POA: Diagnosis not present

## 2023-01-17 DIAGNOSIS — E1169 Type 2 diabetes mellitus with other specified complication: Secondary | ICD-10-CM | POA: Diagnosis not present

## 2023-01-17 DIAGNOSIS — I240 Acute coronary thrombosis not resulting in myocardial infarction: Secondary | ICD-10-CM | POA: Diagnosis not present

## 2023-01-17 DIAGNOSIS — I259 Chronic ischemic heart disease, unspecified: Secondary | ICD-10-CM | POA: Diagnosis not present

## 2023-01-17 DIAGNOSIS — E1159 Type 2 diabetes mellitus with other circulatory complications: Secondary | ICD-10-CM | POA: Diagnosis not present

## 2023-01-31 DIAGNOSIS — I25110 Atherosclerotic heart disease of native coronary artery with unstable angina pectoris: Secondary | ICD-10-CM | POA: Diagnosis not present

## 2023-01-31 DIAGNOSIS — M79602 Pain in left arm: Secondary | ICD-10-CM | POA: Diagnosis not present

## 2023-01-31 DIAGNOSIS — Z7902 Long term (current) use of antithrombotics/antiplatelets: Secondary | ICD-10-CM | POA: Diagnosis not present

## 2023-01-31 DIAGNOSIS — Z87891 Personal history of nicotine dependence: Secondary | ICD-10-CM | POA: Diagnosis not present

## 2023-01-31 DIAGNOSIS — E1169 Type 2 diabetes mellitus with other specified complication: Secondary | ICD-10-CM | POA: Diagnosis not present

## 2023-01-31 DIAGNOSIS — Z888 Allergy status to other drugs, medicaments and biological substances status: Secondary | ICD-10-CM | POA: Diagnosis not present

## 2023-01-31 DIAGNOSIS — I10 Essential (primary) hypertension: Secondary | ICD-10-CM | POA: Diagnosis not present

## 2023-01-31 DIAGNOSIS — E785 Hyperlipidemia, unspecified: Secondary | ICD-10-CM | POA: Diagnosis not present

## 2023-01-31 DIAGNOSIS — Z955 Presence of coronary angioplasty implant and graft: Secondary | ICD-10-CM | POA: Diagnosis not present

## 2023-01-31 DIAGNOSIS — R0789 Other chest pain: Secondary | ICD-10-CM | POA: Diagnosis not present

## 2023-01-31 DIAGNOSIS — Z7984 Long term (current) use of oral hypoglycemic drugs: Secondary | ICD-10-CM | POA: Diagnosis not present

## 2023-01-31 DIAGNOSIS — I25119 Atherosclerotic heart disease of native coronary artery with unspecified angina pectoris: Secondary | ICD-10-CM | POA: Diagnosis not present

## 2023-01-31 DIAGNOSIS — Z7982 Long term (current) use of aspirin: Secondary | ICD-10-CM | POA: Diagnosis not present

## 2023-02-02 DIAGNOSIS — I240 Acute coronary thrombosis not resulting in myocardial infarction: Secondary | ICD-10-CM | POA: Diagnosis not present

## 2023-02-02 DIAGNOSIS — E785 Hyperlipidemia, unspecified: Secondary | ICD-10-CM | POA: Diagnosis not present

## 2023-02-02 DIAGNOSIS — I259 Chronic ischemic heart disease, unspecified: Secondary | ICD-10-CM | POA: Diagnosis not present

## 2023-02-02 DIAGNOSIS — E1169 Type 2 diabetes mellitus with other specified complication: Secondary | ICD-10-CM | POA: Diagnosis not present

## 2023-02-13 DIAGNOSIS — I10 Essential (primary) hypertension: Secondary | ICD-10-CM | POA: Diagnosis not present

## 2023-02-13 DIAGNOSIS — Z6841 Body Mass Index (BMI) 40.0 and over, adult: Secondary | ICD-10-CM | POA: Diagnosis not present

## 2023-02-13 DIAGNOSIS — E782 Mixed hyperlipidemia: Secondary | ICD-10-CM | POA: Diagnosis not present

## 2023-02-13 DIAGNOSIS — E291 Testicular hypofunction: Secondary | ICD-10-CM | POA: Diagnosis not present

## 2023-02-13 DIAGNOSIS — E1165 Type 2 diabetes mellitus with hyperglycemia: Secondary | ICD-10-CM | POA: Diagnosis not present

## 2023-03-05 DIAGNOSIS — E1159 Type 2 diabetes mellitus with other circulatory complications: Secondary | ICD-10-CM | POA: Diagnosis not present

## 2023-03-05 DIAGNOSIS — E785 Hyperlipidemia, unspecified: Secondary | ICD-10-CM | POA: Diagnosis not present

## 2023-03-05 DIAGNOSIS — I10 Essential (primary) hypertension: Secondary | ICD-10-CM | POA: Diagnosis not present

## 2023-03-05 DIAGNOSIS — I2510 Atherosclerotic heart disease of native coronary artery without angina pectoris: Secondary | ICD-10-CM | POA: Diagnosis not present

## 2023-03-20 DIAGNOSIS — E291 Testicular hypofunction: Secondary | ICD-10-CM | POA: Diagnosis not present

## 2023-03-24 ENCOUNTER — Ambulatory Visit
Admission: EM | Admit: 2023-03-24 | Discharge: 2023-03-24 | Disposition: A | Discharge status: Home / Self Care | Payer: BC Managed Care – PPO | Attending: Physician Assistant | Admitting: Physician Assistant

## 2023-03-24 DIAGNOSIS — S91311A Laceration without foreign body, right foot, initial encounter: Secondary | ICD-10-CM

## 2023-03-24 DIAGNOSIS — L600 Ingrowing nail: Secondary | ICD-10-CM | POA: Diagnosis not present

## 2023-03-24 MED ORDER — TETANUS-DIPHTH-ACELL PERTUSSIS 5-2.5-18.5 LF-MCG/0.5 IM SUSY
0.5000 mL | PREFILLED_SYRINGE | Freq: Once | INTRAMUSCULAR | Status: AC
  Administered 2023-03-24: 0.5 mL via INTRAMUSCULAR

## 2023-03-24 MED ORDER — AMOXICILLIN-POT CLAVULANATE 875-125 MG PO TABS: 1.0000 | ORAL_TABLET | Freq: Two times a day (BID) | ORAL | 0 refills | Status: AC

## 2023-03-24 NOTE — Discharge Instructions (Addendum)
-  Soak your feet in warm Epsom salt daily.  Apply hydrocortisone cream around her cuticles to help with swelling. - I sent antibiotic to pharmacy to prevent infection since you sustained a laceration yesterday and likely came into contact with a lot of marine organisms. - If you notice increased swelling, redness, pustular drainage please return. - I put a referral in to podiatry for you.  Give them a call for an appointment.

## 2023-03-24 NOTE — ED Provider Notes (Signed)
[facility]    CSN: [id number] Arrival date & time: 03/24/23  1409      History   Chief Complaint Chief Complaint  Patient presents with   ingrown toenails   Laceration    HPI Sean Randall is a 47 y.o. male presenting for right foot laceration since yesterday. [place]. Unsure of last tetanus immunization.  Denies any significant pain.  There is no surrounding erythema.  No bleeding.  Patient also reports bilateral ingrown great toenails x 1 year.  Past medical history significant for hypertension, GERD, obesity, Type 2 DM, CAD, ischemic heart disease, and hyperlipidemia.   HPI  Past Medical History:  Diagnosis Date   Anxiety    Atherogenic dyslipidemia    Hi LDL low HDL, high TG -> complicated by   CAD S/P percutaneous coronary angioplasty 03/2022   Multiple episodes of chest pain-equivocal Myoview: Cardiac cath July 2023 revealed 90% mid LAD, 80% distal LCx => initial PCI of LAD staged PCI to LCx 05/10/2022-at the time also noted to have 80% distal LAD with TIMI moderate disease in RCA, D1 and proximal LCx.   ED (erectile dysfunction) of organic origin    Essential hypertension    GERD (gastroesophageal reflux disease)    Hypertriglyceridemia, sporadic    Triglycerides anywhere from 300->1100   Hypogonadism male    Obesity due to excess calories    BMI almost 38 with HTN, HLD, high TG and low HDL and DM-2   Tear of MCL (medial collateral ligament) of knee, right, sequela    Type II diabetes mellitus with complication (HCC)    Most recent A1c 11.1-has CAD, obesity, HTN and atherogenic dyslipidemia (high TG and low HDL)    Patient Active Problem List   Diagnosis Date Noted   Essential hypertension 08/11/2022   Atherogenic dyslipidemia 08/11/2022   Hypertriglyceridemia, sporadic 08/11/2022   Obesity due to excess calories 08/11/2022   CAD S/P percutaneous coronary angioplasty 03/2022    Past Surgical History:   Procedure Laterality Date   HERNIA REPAIR  2016   LAPAROSCOPIC APPENDECTOMY  2000   LEFT HEART CATH AND CORONARY ANGIOGRAPHY  03/22/2012   ([facility]-Dr. Andrey Farmer): - Severe 2v CAD including 90% LAD and 80% distal LCx: Codominant: Large LAD - mLAD 90%, dLAD moderate "irregularity", sm-mod D1 moderate dz, Lg D2 -mild dz, sm-mod D2 mild. Large LCx - dCx 80%, sm-mod OM1, Lg OM2, sm OM3; Ectatic RCA, mid RCA 40-50% ~ ulcerated, sm-mod PDA & PL1 - mild dz.   LEFT HEART CATH AND CORONARY ANGIOGRAPHY  05/10/2022   [facility]-Dr. Andrey Farmer) patent mLAD stent, PROGRESSION of dLAD up to 80% TIMI 2 flow (sm-mod - not PCI favorable). D1 50%. Mid LCx 50%, dLCx 80%   Nuclear Stress Test  03/08/2022   [facility]: Bruce protocol 9:00 -> 10.2 METS.  Frequent PVCs at peak stress and resolving recover.  Chest risk and recovery.  No ST changes. HTN response with peak BP 225/110 mmHg. small size, moderate severity fixed defect involving basal inferior segment likely c/w diaphragmatic attenuation-NO ISCHEMIA or INFARCTION.  EF 65%   PERCUTANEOUS CORONARY STENT INTERVENTION (PCI-S)  03/22/2022   ([facility]-Dr. Andrey Farmer): Successful PCI to the mid LAD 90% stenosis with a Synergy 3.50x16 mm stent (Required Scoreflex & Lee balloon - post-dialted to 4.0 mm)   PERCUTANEOUS CORONARY STENT INTERVENTION (PCI-S)  05/10/2022   [facility]Dr. Andrey Farmer): "Staged" PCI of dLCx : Synergy DES 3 x  20 mm -> 3.3 mm   TRANSTHORACIC ECHOCARDIOGRAM  02/11/2022   [facility]: moderate LVH, normal EF ~60%, grade 3 DD-severely elevated LAP (moderate LA dilation). Normal RV, mild AI       Home Medications    Prior to Admission medications   Medication Sig Start Date End Date Taking? Authorizing Provider  amoxicillin-clavulanate (AUGMENTIN) 875-125 MG tablet Take 1 tablet by mouth every 12 (twelve) hours for 7 days. 03/24/23 03/31/23 Yes Eusebio Friendly B, PA-C  atorvastatin (LIPITOR) 40 MG tablet Take 40 mg by mouth daily.    [provider]  clopidogrel (PLAVIX) 75 MG tablet Take 75 mg by mouth daily.    [provider]  [facility] ASPIRIN LOW DOSE 81 MG tablet Take 81 mg by mouth daily.    [provider]  fenofibrate 54 MG tablet Take 1 tablet (54 mg total) by mouth daily. 08/05/22   Marykay Lex, MD  glipiZIDE (GLUCOTROL XL) 5 MG 24 hr tablet Take 5 mg by mouth every morning. 07/11/22   [provider]  isosorbide mononitrate (IMDUR) 30 MG 24 hr tablet Take 30 mg by mouth daily. 04/22/22   [provider]  metFORMIN (GLUCOPHAGE) 500 MG tablet Take 1,000 mg by mouth 2 (two) times daily.    [provider]  metoprolol succinate (TOPROL-XL) 25 MG 24 hr tablet Take 50 mg by mouth daily.    [provider]  Olmesartan-amLODIPine-HCTZ 40-10-12.5 MG TABS Take 1 tablet by mouth daily.    [provider]  omeprazole (PRILOSEC) 40 MG capsule Take 1 capsule by mouth daily. 01/14/20   [provider]  VASCEPA 1 g capsule Take 2 g by mouth 2 (two) times daily. 07/16/22   [provider]    Family History Family History  Problem Relation Age of Onset   Diabetes Mellitus II Mother    Hyperlipidemia Mother    Diabetes Mellitus II Father    Coronary artery disease Father 67       PCI   Hypertension Father    Hyperlipidemia Father    Heart failure Father    COPD Father        Smoker   Heart attack Father     Social History Social History   Tobacco Use   Smoking status: Never   Smokeless tobacco: Current    Types: Snuff  Substance Use Topics   Alcohol use: Yes    Comment: Occasional when he is not having to work.   Drug use: Not Currently     Allergies   Promethazine   Review of Systems Review of Systems  Constitutional:  Negative for fatigue and fever.  Musculoskeletal:  Negative for arthralgias and gait problem.  Skin:  Positive for wound. Negative for color change.  Neurological:  Negative for weakness and numbness.      Physical Exam Triage Vital Signs ED Triage Vitals  Encounter Vitals Group     BP 03/24/23 1451 101/71     Systolic BP Percentile --      Diastolic BP Percentile --      Pulse Rate 03/24/23 1451 73     Resp 03/24/23 1451 19     Temp 03/24/23 1451 98.5 F (36.9 C)     Temp Source 03/24/23 1451 Oral     SpO2 03/24/23 1451 97 %     Weight --      Height --      Head Circumference --  Peak Flow --      Pain Score 03/24/23 1450 3     Pain Loc --      Pain Education --      Exclude from Growth Chart --    No data found.  Updated Vital Signs BP 101/71 (BP Location: Left Arm)   Pulse 73   Temp 98.5 F (36.9 C) (Oral)   Resp 19   SpO2 97%     Physical Exam Vitals and nursing note reviewed.  Constitutional:      General: He is not in acute distress.    Appearance: Normal appearance. He is well-developed. He is not ill-appearing.  HENT:     Head: Normocephalic and atraumatic.  Eyes:     General: No scleral icterus.    Conjunctiva/sclera: Conjunctivae normal.  Cardiovascular:     Rate and Rhythm: Normal rate and regular rhythm.     Pulses: Normal pulses.  Pulmonary:     Effort: Pulmonary effort is normal. No respiratory distress.  Musculoskeletal:     Cervical back: Neck supple.       Feet:  Feet:     Right foot:     Toenail Condition: Right toenails are ingrown.     Left foot:     Toenail Condition: Left toenails are abnormally thick.     Comments: 1.5 cm superficial laceration plantar right foot. No bleeding or erythema.  Left great toenail is thick and yellow.  Skin:    General: Skin is warm and dry.     Capillary Refill: Capillary refill takes less than 2 seconds.  Neurological:     General: No focal deficit present.     Mental Status: He is alert. Mental status is at baseline.     Motor: No weakness.     Gait: Gait normal.  Psychiatric:        Mood and Affect: Mood normal.        Behavior: Behavior normal.      UC Treatments / Results   Labs (all labs ordered are listed, but only abnormal results are displayed) Labs Reviewed - No data to display  EKG   Radiology No results found.  Procedures Procedures (including critical care time)  Medications Ordered in UC Medications  Tdap (BOOSTRIX) injection 0.5 mL (0.5 mLs Intramuscular Given 03/24/23 1502)    Initial Impression / Assessment and Plan / UC Course  I have reviewed the triage vital signs and the nursing notes.  Pertinent labs & imaging results that were available during my care of the patient were reviewed by me and considered in my medical decision making (see chart for details).   47 y/o male with history of T2DM, HTN, HLD, obesity, CAD and ischemic heart disease presents for laceration of great toe since yesterday. [place] Unsure of last tetanus immunization. Also reporting 1 year history of ingrown toenails of great toes.  Vitals are normal and stable.  On exam he has a superficial laceration of the plantar right foot and ingrown toenail of the right foot without any significant cuticle erythema.  Mild swelling of the cuticle around the right great toe.  Thick yellow left great toenail.  Referral placed to podiatry for 1 year history of ingrown toenails.  Prophylactic treatment for laceration since patient is a diabetic with heart condition.  Patient placed on Augmentin.  Discussed wound care guidelines.  Tetanus updated.  Advised to return if signs of infection.   Final  Clinical Impressions(s) / UC Diagnoses   Final diagnoses:  Laceration of right foot, initial encounter  Ingrown toenail     Discharge Instructions      -Soak your feet in warm Epsom salt daily.  Apply hydrocortisone cream around her cuticles to help with swelling. - I sent antibiotic to pharmacy to prevent infection since you sustained a laceration yesterday and likely came into contact with a lot of marine organisms. - If you notice increased  swelling, redness, pustular drainage please return. - I put a referral in to podiatry for you.  Give them a call for an appointment.     ED Prescriptions     Medication Sig Dispense Auth. Provider   amoxicillin-clavulanate (AUGMENTIN) 875-125 MG tablet Take 1 tablet by mouth every 12 (twelve) hours for 7 days. 14 tablet Gareth Morgan      PDMP not reviewed this encounter.   Shirlee Latch, PA-C 03/24/23 1541

## 2023-03-24 NOTE — ED Triage Notes (Signed)
Pt is here with a right foot big toe laceration that happened yesterday sfter stepping on rocks. Pt has an ingrown toenail on both big toes states he has put it off for to a year now.

## 2023-03-26 DIAGNOSIS — R0689 Other abnormalities of breathing: Secondary | ICD-10-CM | POA: Diagnosis not present

## 2023-03-26 DIAGNOSIS — Z6841 Body Mass Index (BMI) 40.0 and over, adult: Secondary | ICD-10-CM | POA: Diagnosis not present

## 2023-03-26 DIAGNOSIS — Z955 Presence of coronary angioplasty implant and graft: Secondary | ICD-10-CM | POA: Diagnosis not present

## 2023-03-26 DIAGNOSIS — R0602 Shortness of breath: Secondary | ICD-10-CM | POA: Diagnosis not present

## 2023-03-26 DIAGNOSIS — R001 Bradycardia, unspecified: Secondary | ICD-10-CM | POA: Diagnosis not present

## 2023-03-26 DIAGNOSIS — E785 Hyperlipidemia, unspecified: Secondary | ICD-10-CM | POA: Diagnosis not present

## 2023-03-26 DIAGNOSIS — R079 Chest pain, unspecified: Secondary | ICD-10-CM | POA: Diagnosis not present

## 2023-03-26 DIAGNOSIS — Z7902 Long term (current) use of antithrombotics/antiplatelets: Secondary | ICD-10-CM | POA: Diagnosis not present

## 2023-03-26 DIAGNOSIS — Z743 Need for continuous supervision: Secondary | ICD-10-CM | POA: Diagnosis not present

## 2023-03-26 DIAGNOSIS — I10 Essential (primary) hypertension: Secondary | ICD-10-CM | POA: Diagnosis not present

## 2023-03-26 DIAGNOSIS — I2109 ST elevation (STEMI) myocardial infarction involving other coronary artery of anterior wall: Secondary | ICD-10-CM | POA: Diagnosis not present

## 2023-03-26 DIAGNOSIS — I319 Disease of pericardium, unspecified: Secondary | ICD-10-CM | POA: Diagnosis not present

## 2023-03-26 DIAGNOSIS — Z7984 Long term (current) use of oral hypoglycemic drugs: Secondary | ICD-10-CM | POA: Diagnosis not present

## 2023-03-26 DIAGNOSIS — R9431 Abnormal electrocardiogram [ECG] [EKG]: Secondary | ICD-10-CM | POA: Diagnosis not present

## 2023-03-26 DIAGNOSIS — I213 ST elevation (STEMI) myocardial infarction of unspecified site: Secondary | ICD-10-CM | POA: Diagnosis not present

## 2023-03-26 DIAGNOSIS — E119 Type 2 diabetes mellitus without complications: Secondary | ICD-10-CM | POA: Diagnosis not present

## 2023-03-26 DIAGNOSIS — R531 Weakness: Secondary | ICD-10-CM | POA: Diagnosis not present

## 2023-03-26 DIAGNOSIS — E781 Pure hyperglyceridemia: Secondary | ICD-10-CM | POA: Diagnosis not present

## 2023-03-26 DIAGNOSIS — E1165 Type 2 diabetes mellitus with hyperglycemia: Secondary | ICD-10-CM | POA: Diagnosis not present

## 2023-03-26 DIAGNOSIS — R0902 Hypoxemia: Secondary | ICD-10-CM | POA: Diagnosis not present

## 2023-03-26 DIAGNOSIS — I499 Cardiac arrhythmia, unspecified: Secondary | ICD-10-CM | POA: Diagnosis not present

## 2023-03-26 DIAGNOSIS — I2510 Atherosclerotic heart disease of native coronary artery without angina pectoris: Secondary | ICD-10-CM | POA: Diagnosis not present

## 2023-03-26 DIAGNOSIS — G4733 Obstructive sleep apnea (adult) (pediatric): Secondary | ICD-10-CM | POA: Diagnosis not present

## 2023-03-26 DIAGNOSIS — I2129 ST elevation (STEMI) myocardial infarction involving other sites: Secondary | ICD-10-CM | POA: Diagnosis not present

## 2023-03-26 DIAGNOSIS — I219 Acute myocardial infarction, unspecified: Secondary | ICD-10-CM | POA: Diagnosis not present

## 2023-03-26 DIAGNOSIS — K219 Gastro-esophageal reflux disease without esophagitis: Secondary | ICD-10-CM | POA: Diagnosis not present

## 2023-03-26 DIAGNOSIS — I517 Cardiomegaly: Secondary | ICD-10-CM | POA: Diagnosis not present

## 2023-03-26 DIAGNOSIS — I441 Atrioventricular block, second degree: Secondary | ICD-10-CM | POA: Diagnosis not present

## 2023-03-26 DIAGNOSIS — I440 Atrioventricular block, first degree: Secondary | ICD-10-CM | POA: Diagnosis not present

## 2023-03-26 DIAGNOSIS — R0989 Other specified symptoms and signs involving the circulatory and respiratory systems: Secondary | ICD-10-CM | POA: Diagnosis not present

## 2023-03-26 DIAGNOSIS — N179 Acute kidney failure, unspecified: Secondary | ICD-10-CM | POA: Diagnosis not present

## 2023-03-26 DIAGNOSIS — S91301A Unspecified open wound, right foot, initial encounter: Secondary | ICD-10-CM | POA: Diagnosis not present

## 2023-03-26 DIAGNOSIS — R0789 Other chest pain: Secondary | ICD-10-CM | POA: Diagnosis not present

## 2023-04-02 ENCOUNTER — Ambulatory Visit: Payer: BC Managed Care – PPO | Admitting: Podiatry

## 2023-04-02 VITALS — BP 172/110

## 2023-04-02 DIAGNOSIS — L600 Ingrowing nail: Secondary | ICD-10-CM | POA: Diagnosis not present

## 2023-04-02 NOTE — Progress Notes (Signed)
Subjective:  Patient ID: Sean Randall, male    DOB: [birth year],  MRN: [id number]  Chief Complaint  Patient presents with   Nail Problem    NEW PT ingrown toenails - B/L  great toenails painful borders,redness since 2 weeks    47 y.o. male presents with the above complaint.  Patient presents with right medial and left lateral border ingrown painful to touch.  Patient states both of the toes have been noted for a while there is some redness associated.  He has not seen anyone else for the same and he would like to have it removed pain scale 7 out of 10 dull achy in nature.   Review of Systems: Negative except as noted in the HPI. Denies N/V/F/Ch.  Past Medical History:  Diagnosis Date   Anxiety    Atherogenic dyslipidemia    Hi LDL low HDL, high TG -> complicated by   CAD S/P percutaneous coronary angioplasty 03/2022   Multiple episodes of chest pain-equivocal Myoview: Cardiac cath July 2023 revealed 90% mid LAD, 80% distal LCx => initial PCI of LAD staged PCI to LCx 05/10/2022-at the time also noted to have 80% distal LAD with TIMI moderate disease in RCA, D1 and proximal LCx.   ED (erectile dysfunction) of organic origin    Essential hypertension    GERD (gastroesophageal reflux disease)    Hypertriglyceridemia, sporadic    Triglycerides anywhere from 300->1100   Hypogonadism male    Obesity due to excess calories    BMI almost 38 with HTN, HLD, high TG and low HDL and DM-2   Tear of MCL (medial collateral ligament) of knee, right, sequela    Type II diabetes mellitus with complication (HCC)    Most recent A1c 11.1-has CAD, obesity, HTN and atherogenic dyslipidemia (high TG and low HDL)    Current Outpatient Medications:    atorvastatin (LIPITOR) 40 MG tablet, Take 40 mg by mouth daily., Disp: , Rfl:    clopidogrel (PLAVIX) 75 MG tablet, Take 75 mg by mouth daily., Disp: , Rfl:    [facility]S ASPIRIN LOW DOSE 81 MG tablet, Take 81 mg by mouth daily., Disp: , Rfl:    fenofibrate 54 MG  tablet, Take 1 tablet (54 mg total) by mouth daily., Disp: 90 tablet, Rfl: 3   glipiZIDE (GLUCOTROL XL) 5 MG 24 hr tablet, Take 5 mg by mouth every morning., Disp: , Rfl:    isosorbide mononitrate (IMDUR) 30 MG 24 hr tablet, Take 30 mg by mouth daily., Disp: , Rfl:    metFORMIN (GLUCOPHAGE) 500 MG tablet, Take 1,000 mg by mouth 2 (two) times daily., Disp: , Rfl:    metoprolol succinate (TOPROL-XL) 25 MG 24 hr tablet, Take 50 mg by mouth daily., Disp: , Rfl:    Olmesartan-amLODIPine-HCTZ 40-10-12.5 MG TABS, Take 1 tablet by mouth daily., Disp: , Rfl:    omeprazole (PRILOSEC) 40 MG capsule, Take 1 capsule by mouth daily., Disp: , Rfl:    VASCEPA 1 g capsule, Take 2 g by mouth 2 (two) times daily., Disp: , Rfl:   Social History   Tobacco Use  Smoking Status Never  Smokeless Tobacco Current   Types: Snuff    Allergies  Allergen Reactions   Promethazine Other (See Comments)    Patient states "makes me floppy"  jerking   Objective:   Vitals:   04/02/23 1408  BP: (!) 172/110   There is no height or weight on file to calculate BMI. Constitutional Well developed. Well nourished.  Vascular Dorsalis pedis pulses palpable bilaterally. Posterior tibial pulses palpable bilaterally. Capillary refill normal to all digits.  No cyanosis or clubbing noted. Pedal hair growth normal.  Neurologic Normal speech. Oriented to person, place, and time. Epicritic sensation to light touch grossly present bilaterally.  Dermatologic Painful ingrowing nail at  right medial left lateral  nail borders of the hallux nail bilaterally. No other open wounds. No skin lesions.  Orthopedic: Normal joint ROM without pain or crepitus bilaterally. No visible deformities. No bony tenderness.   Radiographs: None Assessment:   1. Ingrown toenail of right foot   2. Ingrown left big toenail    Plan:  Patient was evaluated and treated and all questions answered.  Ingrown Nail, bilaterally -Patient elects to  proceed with minor surgery to remove ingrown toenail removal today. Consent reviewed and signed by patient. -Ingrown nail excised. See procedure note. -Educated on post-procedure care including soaking. Written instructions provided and reviewed. -Patient to follow up in 2 weeks for nail check.  Procedure: Excision of Ingrown Toenail Location: Bilateral 1st toe  right medial and left lateral  nail borders. Anesthesia: Lidocaine 1% plain; 1.5 mL and Marcaine 0.5% plain; 1.5 mL, digital block. Skin Prep: Betadine. Dressing: Silvadene; telfa; dry, sterile, compression dressing. Technique: Following skin prep, the toe was exsanguinated and a tourniquet was secured at the base of the toe. The affected nail border was freed, split with a nail splitter, and excised. Chemical matrixectomy was then performed with phenol and irrigated out with alcohol. The tourniquet was then removed and sterile dressing applied. Disposition: Patient tolerated procedure well. Patient to return in 2 weeks for follow-up.   No follow-ups on file.  Right medial ingrown Left lateral ingrown

## 2023-04-09 DIAGNOSIS — E785 Hyperlipidemia, unspecified: Secondary | ICD-10-CM | POA: Diagnosis not present

## 2023-04-09 DIAGNOSIS — I10 Essential (primary) hypertension: Secondary | ICD-10-CM | POA: Diagnosis not present

## 2023-04-09 DIAGNOSIS — I309 Acute pericarditis, unspecified: Secondary | ICD-10-CM | POA: Diagnosis not present

## 2023-04-09 DIAGNOSIS — I2510 Atherosclerotic heart disease of native coronary artery without angina pectoris: Secondary | ICD-10-CM | POA: Diagnosis not present

## 2023-04-10 ENCOUNTER — Ambulatory Visit: Payer: BC Managed Care – PPO | Admitting: Podiatry

## 2023-04-15 DIAGNOSIS — R0683 Snoring: Secondary | ICD-10-CM | POA: Diagnosis not present

## 2023-04-15 DIAGNOSIS — Z6839 Body mass index (BMI) 39.0-39.9, adult: Secondary | ICD-10-CM | POA: Diagnosis not present

## 2023-04-15 DIAGNOSIS — R160 Hepatomegaly, not elsewhere classified: Secondary | ICD-10-CM | POA: Diagnosis not present

## 2023-04-15 DIAGNOSIS — I319 Disease of pericardium, unspecified: Secondary | ICD-10-CM | POA: Diagnosis not present

## 2023-04-15 DIAGNOSIS — Z79899 Other long term (current) drug therapy: Secondary | ICD-10-CM | POA: Diagnosis not present

## 2023-04-15 DIAGNOSIS — N179 Acute kidney failure, unspecified: Secondary | ICD-10-CM | POA: Diagnosis not present

## 2023-04-16 ENCOUNTER — Other Ambulatory Visit: Payer: Self-pay | Admitting: Nurse Practitioner

## 2023-04-16 DIAGNOSIS — R160 Hepatomegaly, not elsewhere classified: Secondary | ICD-10-CM

## 2023-04-22 DIAGNOSIS — G4733 Obstructive sleep apnea (adult) (pediatric): Secondary | ICD-10-CM | POA: Diagnosis not present

## 2023-04-22 DIAGNOSIS — R0602 Shortness of breath: Secondary | ICD-10-CM | POA: Diagnosis not present

## 2023-04-25 DIAGNOSIS — R0602 Shortness of breath: Secondary | ICD-10-CM | POA: Diagnosis not present

## 2023-04-25 DIAGNOSIS — G4733 Obstructive sleep apnea (adult) (pediatric): Secondary | ICD-10-CM | POA: Diagnosis not present

## 2023-05-02 DIAGNOSIS — E1165 Type 2 diabetes mellitus with hyperglycemia: Secondary | ICD-10-CM | POA: Diagnosis not present

## 2023-05-02 DIAGNOSIS — Z794 Long term (current) use of insulin: Secondary | ICD-10-CM | POA: Diagnosis not present

## 2023-05-02 DIAGNOSIS — E782 Mixed hyperlipidemia: Secondary | ICD-10-CM | POA: Diagnosis not present

## 2023-05-05 DIAGNOSIS — E291 Testicular hypofunction: Secondary | ICD-10-CM | POA: Diagnosis not present

## 2023-05-09 ENCOUNTER — Ambulatory Visit
Admission: RE | Admit: 2023-05-09 | Discharge: 2023-05-09 | Disposition: A | Discharge status: Home / Self Care | Payer: BC Managed Care – PPO | Source: Ambulatory Visit | Attending: Nurse Practitioner | Admitting: Nurse Practitioner

## 2023-05-09 DIAGNOSIS — R160 Hepatomegaly, not elsewhere classified: Secondary | ICD-10-CM | POA: Diagnosis not present

## 2023-05-09 MED ORDER — GADOPICLENOL 0.5 MMOL/ML IV SOLN
10.0000 mL | Freq: Once | INTRAVENOUS | Status: AC | PRN
  Administered 2023-05-09: 10 mL via INTRAVENOUS

## 2023-05-26 DIAGNOSIS — G4733 Obstructive sleep apnea (adult) (pediatric): Secondary | ICD-10-CM | POA: Diagnosis not present

## 2023-05-27 DIAGNOSIS — I309 Acute pericarditis, unspecified: Secondary | ICD-10-CM | POA: Diagnosis not present

## 2023-05-27 DIAGNOSIS — E1169 Type 2 diabetes mellitus with other specified complication: Secondary | ICD-10-CM | POA: Diagnosis not present

## 2023-05-27 DIAGNOSIS — E1159 Type 2 diabetes mellitus with other circulatory complications: Secondary | ICD-10-CM | POA: Diagnosis not present

## 2023-05-27 DIAGNOSIS — I240 Acute coronary thrombosis not resulting in myocardial infarction: Secondary | ICD-10-CM | POA: Diagnosis not present

## 2023-05-28 DIAGNOSIS — K219 Gastro-esophageal reflux disease without esophagitis: Secondary | ICD-10-CM | POA: Diagnosis not present

## 2023-05-28 DIAGNOSIS — I10 Essential (primary) hypertension: Secondary | ICD-10-CM | POA: Diagnosis not present

## 2023-05-28 DIAGNOSIS — E1165 Type 2 diabetes mellitus with hyperglycemia: Secondary | ICD-10-CM | POA: Diagnosis not present

## 2023-05-28 DIAGNOSIS — E782 Mixed hyperlipidemia: Secondary | ICD-10-CM | POA: Diagnosis not present

## 2023-05-28 DIAGNOSIS — Z125 Encounter for screening for malignant neoplasm of prostate: Secondary | ICD-10-CM | POA: Diagnosis not present

## 2023-05-28 DIAGNOSIS — I5030 Unspecified diastolic (congestive) heart failure: Secondary | ICD-10-CM | POA: Diagnosis not present

## 2023-05-28 DIAGNOSIS — E291 Testicular hypofunction: Secondary | ICD-10-CM | POA: Diagnosis not present

## 2023-06-03 DIAGNOSIS — E291 Testicular hypofunction: Secondary | ICD-10-CM | POA: Diagnosis not present

## 2023-06-06 DIAGNOSIS — R079 Chest pain, unspecified: Secondary | ICD-10-CM | POA: Diagnosis not present

## 2023-06-06 DIAGNOSIS — E781 Pure hyperglyceridemia: Secondary | ICD-10-CM | POA: Diagnosis not present

## 2023-06-06 DIAGNOSIS — Z79899 Other long term (current) drug therapy: Secondary | ICD-10-CM | POA: Diagnosis not present

## 2023-06-06 DIAGNOSIS — I2510 Atherosclerotic heart disease of native coronary artery without angina pectoris: Secondary | ICD-10-CM | POA: Diagnosis not present

## 2023-06-06 DIAGNOSIS — Z794 Long term (current) use of insulin: Secondary | ICD-10-CM | POA: Diagnosis not present

## 2023-06-06 DIAGNOSIS — I300 Acute nonspecific idiopathic pericarditis: Secondary | ICD-10-CM | POA: Diagnosis not present

## 2023-06-06 DIAGNOSIS — K760 Fatty (change of) liver, not elsewhere classified: Secondary | ICD-10-CM | POA: Diagnosis not present

## 2023-06-06 DIAGNOSIS — Z7984 Long term (current) use of oral hypoglycemic drugs: Secondary | ICD-10-CM | POA: Diagnosis not present

## 2023-06-06 DIAGNOSIS — I129 Hypertensive chronic kidney disease with stage 1 through stage 4 chronic kidney disease, or unspecified chronic kidney disease: Secondary | ICD-10-CM | POA: Diagnosis not present

## 2023-06-06 DIAGNOSIS — F17200 Nicotine dependence, unspecified, uncomplicated: Secondary | ICD-10-CM | POA: Diagnosis not present

## 2023-06-06 DIAGNOSIS — G4733 Obstructive sleep apnea (adult) (pediatric): Secondary | ICD-10-CM | POA: Diagnosis not present

## 2023-06-06 DIAGNOSIS — N1831 Chronic kidney disease, stage 3a: Secondary | ICD-10-CM | POA: Diagnosis not present

## 2023-06-06 DIAGNOSIS — Z8249 Family history of ischemic heart disease and other diseases of the circulatory system: Secondary | ICD-10-CM | POA: Diagnosis not present

## 2023-06-06 DIAGNOSIS — R072 Precordial pain: Secondary | ICD-10-CM | POA: Diagnosis not present

## 2023-06-06 DIAGNOSIS — Z7985 Long-term (current) use of injectable non-insulin antidiabetic drugs: Secondary | ICD-10-CM | POA: Diagnosis not present

## 2023-06-06 DIAGNOSIS — E1122 Type 2 diabetes mellitus with diabetic chronic kidney disease: Secondary | ICD-10-CM | POA: Diagnosis not present

## 2023-06-06 DIAGNOSIS — G4700 Insomnia, unspecified: Secondary | ICD-10-CM | POA: Diagnosis not present

## 2023-06-07 DIAGNOSIS — I700 Atherosclerosis of aorta: Secondary | ICD-10-CM | POA: Diagnosis not present

## 2023-06-07 DIAGNOSIS — I440 Atrioventricular block, first degree: Secondary | ICD-10-CM | POA: Diagnosis not present

## 2023-06-07 DIAGNOSIS — R072 Precordial pain: Secondary | ICD-10-CM | POA: Diagnosis not present

## 2023-06-07 DIAGNOSIS — I300 Acute nonspecific idiopathic pericarditis: Secondary | ICD-10-CM | POA: Diagnosis not present

## 2023-06-25 DIAGNOSIS — G4733 Obstructive sleep apnea (adult) (pediatric): Secondary | ICD-10-CM | POA: Diagnosis not present

## 2023-07-10 DIAGNOSIS — E291 Testicular hypofunction: Secondary | ICD-10-CM | POA: Diagnosis not present

## 2023-07-10 DIAGNOSIS — I200 Unstable angina: Secondary | ICD-10-CM | POA: Diagnosis not present

## 2023-07-23 DIAGNOSIS — N189 Chronic kidney disease, unspecified: Secondary | ICD-10-CM | POA: Diagnosis not present

## 2023-07-23 DIAGNOSIS — I25118 Atherosclerotic heart disease of native coronary artery with other forms of angina pectoris: Secondary | ICD-10-CM | POA: Diagnosis not present

## 2023-07-23 DIAGNOSIS — E785 Hyperlipidemia, unspecified: Secondary | ICD-10-CM | POA: Diagnosis not present

## 2023-07-23 DIAGNOSIS — Z87891 Personal history of nicotine dependence: Secondary | ICD-10-CM | POA: Diagnosis not present

## 2023-07-23 DIAGNOSIS — I129 Hypertensive chronic kidney disease with stage 1 through stage 4 chronic kidney disease, or unspecified chronic kidney disease: Secondary | ICD-10-CM | POA: Diagnosis not present

## 2023-07-23 DIAGNOSIS — I200 Unstable angina: Secondary | ICD-10-CM | POA: Diagnosis not present

## 2023-07-23 DIAGNOSIS — I440 Atrioventricular block, first degree: Secondary | ICD-10-CM | POA: Diagnosis not present

## 2023-07-23 DIAGNOSIS — I445 Left posterior fascicular block: Secondary | ICD-10-CM | POA: Diagnosis not present

## 2023-07-23 DIAGNOSIS — Z0389 Encounter for observation for other suspected diseases and conditions ruled out: Secondary | ICD-10-CM | POA: Diagnosis not present

## 2023-07-23 DIAGNOSIS — I493 Ventricular premature depolarization: Secondary | ICD-10-CM | POA: Diagnosis not present

## 2023-07-26 DIAGNOSIS — G4733 Obstructive sleep apnea (adult) (pediatric): Secondary | ICD-10-CM | POA: Diagnosis not present

## 2023-07-29 DIAGNOSIS — E1169 Type 2 diabetes mellitus with other specified complication: Secondary | ICD-10-CM | POA: Diagnosis not present

## 2023-07-29 DIAGNOSIS — I240 Acute coronary thrombosis not resulting in myocardial infarction: Secondary | ICD-10-CM | POA: Diagnosis not present

## 2023-07-29 DIAGNOSIS — S8992XA Unspecified injury of left lower leg, initial encounter: Secondary | ICD-10-CM | POA: Diagnosis not present

## 2023-07-29 DIAGNOSIS — Z6835 Body mass index (BMI) 35.0-35.9, adult: Secondary | ICD-10-CM | POA: Diagnosis not present

## 2023-07-29 DIAGNOSIS — E1159 Type 2 diabetes mellitus with other circulatory complications: Secondary | ICD-10-CM | POA: Diagnosis not present

## 2023-08-12 DIAGNOSIS — E291 Testicular hypofunction: Secondary | ICD-10-CM | POA: Diagnosis not present

## 2023-08-15 DIAGNOSIS — K7581 Nonalcoholic steatohepatitis (NASH): Secondary | ICD-10-CM | POA: Diagnosis not present

## 2023-08-15 DIAGNOSIS — D1803 Hemangioma of intra-abdominal structures: Secondary | ICD-10-CM | POA: Diagnosis not present

## 2023-08-15 DIAGNOSIS — R748 Abnormal levels of other serum enzymes: Secondary | ICD-10-CM | POA: Diagnosis not present

## 2023-08-25 ENCOUNTER — Other Ambulatory Visit: Payer: Self-pay

## 2023-08-25 DIAGNOSIS — S61212A Laceration without foreign body of right middle finger without damage to nail, initial encounter: Secondary | ICD-10-CM | POA: Diagnosis not present

## 2023-08-25 DIAGNOSIS — W540XXA Bitten by dog, initial encounter: Secondary | ICD-10-CM | POA: Diagnosis not present

## 2023-08-25 DIAGNOSIS — S61252A Open bite of right middle finger without damage to nail, initial encounter: Secondary | ICD-10-CM | POA: Diagnosis not present

## 2023-08-25 DIAGNOSIS — S61051A Open bite of right thumb without damage to nail, initial encounter: Secondary | ICD-10-CM | POA: Insufficient documentation

## 2023-08-25 DIAGNOSIS — Z5321 Procedure and treatment not carried out due to patient leaving prior to being seen by health care provider: Secondary | ICD-10-CM | POA: Diagnosis not present

## 2023-08-25 NOTE — ED Triage Notes (Signed)
Pt reports being bitten by his dog tonight, pt has laceration to right middle finger and thumb nail. Pt states he is unsure if dog was up to date on shots.

## 2023-08-25 NOTE — ED Provider Triage Note (Signed)
Emergency Medicine Provider Triage Evaluation Note  Sean Randall , a 47 y.o. male  was evaluated in triage.  Pt complains of dog bite.  Patient was picking up his dog to put in a his crate when the dog bit him on the right middle finger and thumb.  Unknown status of dog vaccination including rabies.  Patient rinsed bite with warm and cool water prior to ED arrival.  Unknown tetanus status.  Review of Systems  Positive:  Negative:   Physical Exam  BP (!) 133/96   Pulse 85   Temp 98.6 F (37 C)   Resp 18   Ht 5\' 8"  (1.727 m)   Wt 113.4 kg   SpO2 98%   BMI 38.01 kg/m  Gen:   Awake, no distress   Resp:  Normal effort  MSK:   Moves extremities without difficulty  Other:  Approximately 1 cm linear dog bite to right middle finger.  Thumb nailbed intact.  Neurovascular status intact.  Good Capillary refills.  Medical Decision Making  Medically screening exam initiated at 9:58 PM.  Appropriate orders placed.  Sean Randall was informed that the remainder of the evaluation will be completed by another provider, this initial triage assessment does not replace that evaluation, and the importance of remaining in the ED until their evaluation is complete.     Sean Randall, Sean Randall A, PA-C 08/25/23 2201

## 2023-08-26 ENCOUNTER — Emergency Department
Admission: EM | Admit: 2023-08-26 | Discharge: 2023-08-26 | Discharge status: Against Medical Advice | Payer: BC Managed Care – PPO | Attending: Emergency Medicine | Admitting: Emergency Medicine

## 2023-08-26 DIAGNOSIS — S61212A Laceration without foreign body of right middle finger without damage to nail, initial encounter: Secondary | ICD-10-CM | POA: Diagnosis not present

## 2023-08-28 DIAGNOSIS — G4733 Obstructive sleep apnea (adult) (pediatric): Secondary | ICD-10-CM | POA: Diagnosis not present

## 2023-09-01 DIAGNOSIS — E782 Mixed hyperlipidemia: Secondary | ICD-10-CM | POA: Diagnosis not present

## 2023-09-01 DIAGNOSIS — K219 Gastro-esophageal reflux disease without esophagitis: Secondary | ICD-10-CM | POA: Diagnosis not present

## 2023-09-01 DIAGNOSIS — E291 Testicular hypofunction: Secondary | ICD-10-CM | POA: Diagnosis not present

## 2023-09-01 DIAGNOSIS — I10 Essential (primary) hypertension: Secondary | ICD-10-CM | POA: Diagnosis not present

## 2023-09-01 DIAGNOSIS — E1165 Type 2 diabetes mellitus with hyperglycemia: Secondary | ICD-10-CM | POA: Diagnosis not present

## 2023-09-01 DIAGNOSIS — I5030 Unspecified diastolic (congestive) heart failure: Secondary | ICD-10-CM | POA: Diagnosis not present

## 2023-09-08 DIAGNOSIS — M1712 Unilateral primary osteoarthritis, left knee: Secondary | ICD-10-CM | POA: Diagnosis not present

## 2023-09-08 DIAGNOSIS — S83242D Other tear of medial meniscus, current injury, left knee, subsequent encounter: Secondary | ICD-10-CM | POA: Diagnosis not present

## 2023-09-17 DIAGNOSIS — E291 Testicular hypofunction: Secondary | ICD-10-CM | POA: Diagnosis not present

## 2023-09-21 DIAGNOSIS — X58XXXD Exposure to other specified factors, subsequent encounter: Secondary | ICD-10-CM | POA: Diagnosis not present

## 2023-09-21 DIAGNOSIS — M67864 Other specified disorders of tendon, left knee: Secondary | ICD-10-CM | POA: Diagnosis not present

## 2023-09-21 DIAGNOSIS — M1712 Unilateral primary osteoarthritis, left knee: Secondary | ICD-10-CM | POA: Diagnosis not present

## 2023-09-21 DIAGNOSIS — S83242D Other tear of medial meniscus, current injury, left knee, subsequent encounter: Secondary | ICD-10-CM | POA: Diagnosis not present

## 2023-09-21 DIAGNOSIS — S83242A Other tear of medial meniscus, current injury, left knee, initial encounter: Secondary | ICD-10-CM | POA: Diagnosis not present

## 2023-09-26 ENCOUNTER — Encounter: Payer: Self-pay | Admitting: Emergency Medicine

## 2023-09-26 ENCOUNTER — Ambulatory Visit
Admission: EM | Admit: 2023-09-26 | Discharge: 2023-09-26 | Disposition: A | Discharge status: Home / Self Care | Payer: BC Managed Care – PPO | Attending: Emergency Medicine | Admitting: Emergency Medicine

## 2023-09-26 ENCOUNTER — Ambulatory Visit (INDEPENDENT_AMBULATORY_CARE_PROVIDER_SITE_OTHER): Payer: BC Managed Care – PPO

## 2023-09-26 DIAGNOSIS — S6992XA Unspecified injury of left wrist, hand and finger(s), initial encounter: Secondary | ICD-10-CM | POA: Diagnosis not present

## 2023-09-26 DIAGNOSIS — S61215A Laceration without foreign body of left ring finger without damage to nail, initial encounter: Secondary | ICD-10-CM

## 2023-09-26 DIAGNOSIS — M79642 Pain in left hand: Secondary | ICD-10-CM | POA: Diagnosis not present

## 2023-09-26 MED ORDER — AMOXICILLIN-POT CLAVULANATE 875-125 MG PO TABS
1.0000 | ORAL_TABLET | Freq: Two times a day (BID) | ORAL | 0 refills | Status: AC
Start: 2023-09-26 — End: ?

## 2023-09-26 MED ORDER — LIDOCAINE HCL (PF) 1 % IJ SOLN
10.0000 mL | Freq: Once | INTRAMUSCULAR | Status: AC
  Administered 2023-09-26: 10 mL

## 2023-09-26 NOTE — ED Triage Notes (Signed)
Patient states that he fell about 1 hour ago and cut his left 3rd and 4th finger on a metal crate.  Patient has his fingers bandage at this time.

## 2023-09-26 NOTE — Discharge Instructions (Signed)
Keep left hand elevated  Daily dressing changes  Take antibiotic as directed  Follow up in 10-12 days for suture removal.   If you have swelling,redness,drainage,inability to move hand go to ER for further evaluation

## 2023-09-26 NOTE — ED Provider Notes (Addendum)
[facility]    CSN: [id number] Arrival date & time: 09/26/23  1245      History   Chief Complaint Chief Complaint  Patient presents with   Extremity Laceration    left 3rd and 4th   Fall    HPI Sean Randall is a 48 y.o. male.   48 year old male pt, Isrrael Randall, presents to urgent care for evaluation of left hand injury(third and fourth finger).  Patient states about an hour ago he cut his left third and fourth fingers on a metal dog cage.  Patient has his fingers bandaged at this time.  Patient denies being bitten, patient was recently bitten and had laceration repair 1 month prior on right hand.  Tetanus is up to date, less than 3 years  Injury appears to be a dog bite, " I do not have dogs of bite, patient has [employer], pitbull, exotic pitbulls  The history is provided by the patient. No language interpreter was used.    Past Medical History:  Diagnosis Date   Anxiety    Atherogenic dyslipidemia    Hi LDL low HDL, high TG -> complicated by   CAD S/P percutaneous coronary angioplasty 03/2022   Multiple episodes of chest pain-equivocal Myoview: Cardiac cath July 2023 revealed 90% mid LAD, 80% distal LCx => initial PCI of LAD staged PCI to LCx 05/10/2022-at the time also noted to have 80% distal LAD with TIMI moderate disease in RCA, D1 and proximal LCx.   ED (erectile dysfunction) of organic origin    Essential hypertension    GERD (gastroesophageal reflux disease)    Hypertriglyceridemia, sporadic    Triglycerides anywhere from 300->1100   Hypogonadism male    Obesity due to excess calories    BMI almost 38 with HTN, HLD, high TG and low HDL and DM-2   Tear of MCL (medial collateral ligament) of knee, right, sequela    Type II diabetes mellitus with complication (HCC)    Most recent A1c 11.1-has CAD, obesity, HTN and atherogenic dyslipidemia (high TG and low HDL)    Patient Active Problem List   Diagnosis Date Noted   Laceration of left ring finger  without foreign body without damage to nail 09/26/2023   Hand injury, left, initial encounter 09/26/2023   Essential hypertension 08/11/2022   Atherogenic dyslipidemia 08/11/2022   Hypertriglyceridemia, sporadic 08/11/2022   Obesity due to excess calories 08/11/2022   CAD S/P percutaneous coronary angioplasty 03/2022    Past Surgical History:  Procedure Laterality Date   HERNIA REPAIR  2016   LAPAROSCOPIC APPENDECTOMY  2000   LEFT HEART CATH AND CORONARY ANGIOGRAPHY  03/22/2012   ([facility]-Dr. Andrey Farmer): - Severe 2v CAD including 90% LAD and 80% distal LCx: Codominant: Large LAD - mLAD 90%, dLAD moderate "irregularity", sm-mod D1 moderate dz, Lg D2 -mild dz, sm-mod D2 mild. Large LCx - dCx 80%, sm-mod OM1, Lg OM2, sm OM3; Ectatic RCA, mid RCA 40-50% ~ ulcerated, sm-mod PDA & PL1 - mild dz.   LEFT HEART CATH AND CORONARY ANGIOGRAPHY  05/10/2022   [facility]-Dr. Andrey Farmer) patent mLAD stent, PROGRESSION of dLAD up to 80% TIMI 2 flow (sm-mod - not PCI favorable). D1 50%. Mid LCx 50%, dLCx 80%   Nuclear Stress Test  03/08/2022   [facility]: Bruce protocol 9:00 -> 10.2 METS.  Frequent PVCs at peak stress and resolving recover.  Chest risk and recovery.  No ST changes. HTN response with peak BP 225/110 mmHg. small size,  moderate severity fixed defect involving basal inferior segment likely c/w diaphragmatic attenuation-NO ISCHEMIA or INFARCTION.  EF 65%   PERCUTANEOUS CORONARY STENT INTERVENTION (PCI-S)  03/22/2022   ([facility]-Dr. Andrey Farmer): Successful PCI to the mid LAD 90% stenosis with a Synergy 3.50x16 mm stent (Required Scoreflex & De Graff balloon - post-dialted to 4.0 mm)   PERCUTANEOUS CORONARY STENT INTERVENTION (PCI-S)  05/10/2022   ([facility]-Dr. Andrey Farmer): "Staged" PCI of dLCx : Synergy DES 3 x 20 mm -> 3.3 mm   TRANSTHORACIC ECHOCARDIOGRAM  02/11/2022   [facility]: moderate LVH, normal EF ~60%, grade 3 DD-severely elevated LAP (moderate LA dilation). Normal RV, mild AI        Home Medications    Prior to Admission medications   Medication Sig Start Date End Date Taking? Authorizing Provider  amoxicillin-clavulanate (AUGMENTIN) 875-125 MG tablet Take 1 tablet by mouth every 12 (twelve) hours. 09/26/23  Yes Willet Schleifer, Para March, NP  atorvastatin (LIPITOR) 40 MG tablet Take 40 mg by mouth daily.    [provider]  clopidogrel (PLAVIX) 75 MG tablet Take 75 mg by mouth daily.    [provider]  [facility] ASPIRIN LOW DOSE 81 MG tablet Take 81 mg by mouth daily.    [provider]  fenofibrate 54 MG tablet Take 1 tablet (54 mg total) by mouth daily. 08/05/22   Marykay Lex, MD  glipiZIDE (GLUCOTROL XL) 5 MG 24 hr tablet Take 5 mg by mouth every morning. 07/11/22   [provider]  isosorbide mononitrate (IMDUR) 30 MG 24 hr tablet Take 30 mg by mouth daily. 04/22/22   [provider]  metFORMIN (GLUCOPHAGE) 500 MG tablet Take 1,000 mg by mouth 2 (two) times daily.    [provider]  metoprolol succinate (TOPROL-XL) 25 MG 24 hr tablet Take 50 mg by mouth daily.    [provider]  Olmesartan-amLODIPine-HCTZ 40-10-12.5 MG TABS Take 1 tablet by mouth daily.    [provider]  omeprazole (PRILOSEC) 40 MG capsule Take 1 capsule by mouth daily. 01/14/20   [provider]  VASCEPA 1 g capsule Take 2 g by mouth 2 (two) times daily. 07/16/22   [provider]    Family History Family History  Problem Relation Age of Onset   Diabetes Mellitus II Mother    Hyperlipidemia Mother    Diabetes Mellitus II Father    Coronary artery disease Father 58       PCI   Hypertension Father    Hyperlipidemia Father    Heart failure Father    COPD Father        Smoker   Heart attack Father     Social History Social History   Tobacco Use   Smoking status: Never   Smokeless tobacco: Current    Types: Snuff  Vaping Use   Vaping status: Never Used  Substance Use Topics   Alcohol use: Yes     Comment: Occasional when he is not having to work.   Drug use: Not Currently     Allergies   Promethazine   Review of Systems Review of Systems  Constitutional:  Negative for fever.  Skin:  Positive for color change and wound.  All other systems reviewed and are negative.    Physical Exam Triage Vital Signs ED Triage Vitals  Encounter Vitals Group     BP      Systolic BP Percentile      Diastolic BP Percentile      Pulse  Resp      Temp      Temp src      SpO2      Weight      Height      Head Circumference      Peak Flow      Pain Score      Pain Loc      Pain Education      Exclude from Growth Chart    No data found.  Updated Vital Signs BP 134/89 (BP Location: Right Arm)   Pulse 88   Temp 98.4 F (36.9 C) (Oral)   Resp 15   Ht 5\' 8"  (1.727 m)   Wt 250 lb (113.4 kg)   SpO2 95%   BMI 38.01 kg/m   Visual Acuity Right Eye Distance:   Left Eye Distance:   Bilateral Distance:    Right Eye Near:   Left Eye Near:    Bilateral Near:     Physical Exam Vitals and nursing note reviewed.  Constitutional:      Appearance: Normal appearance. He is well-developed and well-groomed.  Cardiovascular:     Rate and Rhythm: Normal rate and regular rhythm.     Pulses: Normal pulses.     Heart sounds: Normal heart sounds.  Pulmonary:     Effort: Pulmonary effort is normal.  Skin:    General: Skin is warm.     Capillary Refill: Capillary refill takes less than 2 seconds.     Findings: Signs of injury, laceration and wound present.     Comments: Left 3rd and 4th fingers numerous abrasions, cuts appear to be c/w dog bite.  Neurological:     General: No focal deficit present.     Mental Status: He is alert and oriented to person, place, and time.     GCS: GCS eye subscore is 4. GCS verbal subscore is 5. GCS motor subscore is 6.     Cranial Nerves: No cranial nerve deficit.     Sensory: No sensory deficit.  Psychiatric:        Attention and Perception:  Attention normal.        Mood and Affect: Mood normal.        Speech: Speech normal.        Behavior: Behavior is cooperative.     UC Treatments / Results  Labs (all labs ordered are listed, but only abnormal results are displayed) Labs Reviewed - No data to display  EKG   Radiology DG Hand Complete Left Result Date: 09/26/2023 CLINICAL DATA:  Left hand pain. EXAM: LEFT HAND - COMPLETE 3+ VIEW COMPARISON:  None Available. FINDINGS: Neutral ulnar variance. There is a chronic spur extending in the lateral and distal direction from the lateral aspect of the distal metaphysis of the thumb metacarpal, measuring up to approximately 6 mm in width at its base and extending up to 5 mm in length. Joint spaces are preserved. No acute fracture or dislocation. IMPRESSION: Chronic spur extending in the lateral and distal direction from the lateral aspect of the distal metaphysis of the thumb metacarpal. This is nonspecific and may represent the sequela of remote trauma. No acute fracture. Electronically Signed   By: Neita Garnet M.D.   On: 09/26/2023 15:01    Procedures Laceration Repair  Date/Time: 09/26/2023 2:00 PM  Performed by: Clancy Gourd, NP Authorized by: Clancy Gourd, NP   Consent:    Consent obtained:  Verbal   Consent given by:  Patient  Risks, benefits, and alternatives were discussed: yes     Risks discussed:  Infection, need for additional repair, nerve damage, pain, poor cosmetic result, poor wound healing, tendon damage and vascular damage Universal protocol:    Procedure explained and questions answered to patient or proxy's satisfaction: yes     Relevant documents present and verified: yes     Test results available: yes     Imaging studies available: yes     Site/side marked: yes     Immediately prior to procedure, a time out was called: yes     Patient identity confirmed:  Verbally with patient and arm band Anesthesia:    Anesthesia method:  Nerve block    Block anesthetic:  Lidocaine 1% w/o epi   Block injection procedure:  Introduced needle, anatomic landmarks palpated, anatomic landmarks identified, negative aspiration for blood and incremental injection   Block outcome:  Anesthesia achieved Laceration details:    Location:  Finger   Finger location:  L ring finger (left long)   Length (cm):  3.5 (left ring, left middle 1 cm) Pre-procedure details:    Preparation:  Patient was prepped and draped in usual sterile fashion and imaging obtained to evaluate for foreign bodies Exploration:    Limited defect created (wound extended): no     Hemostasis achieved with:  Direct pressure   Imaging obtained: x-ray     Imaging outcome: foreign body not noted     Wound exploration: wound explored through full range of motion and entire depth of wound visualized     Wound extent: no foreign bodies/material noted   Treatment:    Area cleansed with:  Povidone-iodine and chlorhexidine   Amount of cleaning:  Extensive   Irrigation solution:  Sterile saline   Irrigation method:  Syringe   Visualized foreign bodies/material removed: no     Debridement:  None   Undermining:  None Skin repair:    Repair method:  Sutures   Suture size:  5-0   Suture material:  Nylon   Suture technique:  Simple interrupted   Number of sutures:  8 (left 4th 7, left 3rd 1) Approximation:    Approximation:  Loose Repair type:    Repair type:  Simple Post-procedure details:    Dressing:  Antibiotic ointment, non-adherent dressing and bulky dressing   Procedure completion:  Tolerated well, no immediate complications (including critical care time)  Medications Ordered in UC Medications  lidocaine (PF) (XYLOCAINE) 1 % injection 10 mL (10 mLs Other Given by Other 09/26/23 1413)    Initial Impression / Assessment and Plan / UC Course  I have reviewed the triage vital signs and the nursing notes.  Pertinent labs & imaging results that were available during my care of the  patient were reviewed by me and considered in my medical decision making (see chart for details).    Discussed exam findings and plan of care with patient, strict go to ER precautions given.   Patient verbalized understanding to this provider.  Ddx: Laceration, abrasion, dog bite Final Clinical Impressions(s) / UC Diagnoses   Final diagnoses:  Hand injury, left, initial encounter  Laceration of left ring finger without foreign body without damage to nail, initial encounter     Discharge Instructions      Keep left hand elevated  Daily dressing changes  Take antibiotic as directed  Follow up in 10-12 days for suture removal.   If you have swelling,redness,drainage,inability to move hand go to ER for further  evaluation     ED Prescriptions     Medication Sig Dispense Auth. Provider   amoxicillin-clavulanate (AUGMENTIN) 875-125 MG tablet Take 1 tablet by mouth every 12 (twelve) hours. 14 tablet Erlinda Solinger, Para March, NP      PDMP not reviewed this encounter.   Clancy Gourd, NP 09/26/23 1918    Clancy Gourd, NP 09/26/23 2004

## 2023-10-15 DIAGNOSIS — M2342 Loose body in knee, left knee: Secondary | ICD-10-CM | POA: Diagnosis not present

## 2023-10-15 DIAGNOSIS — M1712 Unilateral primary osteoarthritis, left knee: Secondary | ICD-10-CM | POA: Diagnosis not present

## 2023-10-15 DIAGNOSIS — S83412D Sprain of medial collateral ligament of left knee, subsequent encounter: Secondary | ICD-10-CM | POA: Diagnosis not present

## 2023-10-15 DIAGNOSIS — S83242D Other tear of medial meniscus, current injury, left knee, subsequent encounter: Secondary | ICD-10-CM | POA: Diagnosis not present

## 2023-10-20 DIAGNOSIS — E291 Testicular hypofunction: Secondary | ICD-10-CM | POA: Diagnosis not present

## 2023-10-22 DIAGNOSIS — I10 Essential (primary) hypertension: Secondary | ICD-10-CM | POA: Diagnosis not present

## 2023-10-22 DIAGNOSIS — I2510 Atherosclerotic heart disease of native coronary artery without angina pectoris: Secondary | ICD-10-CM | POA: Diagnosis not present

## 2023-10-22 DIAGNOSIS — E782 Mixed hyperlipidemia: Secondary | ICD-10-CM | POA: Diagnosis not present

## 2023-10-22 DIAGNOSIS — Z8679 Personal history of other diseases of the circulatory system: Secondary | ICD-10-CM | POA: Diagnosis not present

## 2023-10-23 DIAGNOSIS — M1712 Unilateral primary osteoarthritis, left knee: Secondary | ICD-10-CM | POA: Diagnosis not present

## 2023-11-01 DIAGNOSIS — E1165 Type 2 diabetes mellitus with hyperglycemia: Secondary | ICD-10-CM | POA: Diagnosis not present

## 2023-11-01 DIAGNOSIS — Z794 Long term (current) use of insulin: Secondary | ICD-10-CM | POA: Diagnosis not present
